# Patient Record
Sex: Female | Born: 1960 | Race: White | Hispanic: No | Marital: Married | Smoking: Never smoker
Health system: Southern US, Community
[De-identification: ages and names within clinical notes are randomized; demographics above are authoritative.]

## PROBLEM LIST (undated history)

## (undated) DIAGNOSIS — I10 Essential (primary) hypertension: Secondary | ICD-10-CM

## (undated) DIAGNOSIS — T561X1A Toxic effect of mercury and its compounds, accidental (unintentional), initial encounter: Secondary | ICD-10-CM

## (undated) DIAGNOSIS — E079 Disorder of thyroid, unspecified: Secondary | ICD-10-CM

## (undated) DIAGNOSIS — J309 Allergic rhinitis, unspecified: Secondary | ICD-10-CM

## (undated) HISTORY — PX: CHOLECYSTECTOMY: SHX55

## (undated) HISTORY — DX: Essential (primary) hypertension: I10

## (undated) HISTORY — PX: VAGINAL HYSTERECTOMY: SUR661

## (undated) HISTORY — DX: Allergic rhinitis, unspecified: J30.9

## (undated) HISTORY — DX: Toxic effect of mercury and its compounds, accidental (unintentional), initial encounter: T56.1X1A

## (undated) HISTORY — DX: Disorder of thyroid, unspecified: E07.9

---

## 2004-07-09 ENCOUNTER — Ambulatory Visit: Payer: Self-pay | Admitting: Obstetrics and Gynecology

## 2005-03-20 ENCOUNTER — Other Ambulatory Visit: Payer: Self-pay

## 2005-03-25 ENCOUNTER — Inpatient Hospital Stay: Payer: Self-pay | Admitting: Obstetrics and Gynecology

## 2005-08-19 ENCOUNTER — Ambulatory Visit: Payer: Self-pay | Admitting: Obstetrics and Gynecology

## 2006-09-15 ENCOUNTER — Ambulatory Visit: Payer: Self-pay | Admitting: Obstetrics and Gynecology

## 2007-10-08 ENCOUNTER — Ambulatory Visit: Payer: Self-pay | Admitting: Obstetrics and Gynecology

## 2009-12-04 ENCOUNTER — Ambulatory Visit: Payer: Self-pay | Admitting: Obstetrics and Gynecology

## 2011-02-07 ENCOUNTER — Ambulatory Visit: Payer: Self-pay | Admitting: Obstetrics and Gynecology

## 2012-02-19 ENCOUNTER — Ambulatory Visit: Payer: Self-pay | Admitting: Obstetrics and Gynecology

## 2013-04-29 ENCOUNTER — Ambulatory Visit: Payer: Self-pay | Admitting: Nurse Practitioner

## 2014-06-28 ENCOUNTER — Ambulatory Visit: Payer: Self-pay | Admitting: Nurse Practitioner

## 2014-07-01 ENCOUNTER — Ambulatory Visit: Payer: Self-pay | Admitting: Physician Assistant

## 2014-07-06 ENCOUNTER — Ambulatory Visit: Payer: Self-pay | Admitting: Nurse Practitioner

## 2014-08-05 HISTORY — PX: COLONOSCOPY: SHX174

## 2015-03-14 ENCOUNTER — Ambulatory Visit: Payer: Self-pay | Admitting: Family Medicine

## 2015-03-14 ENCOUNTER — Encounter: Payer: Self-pay | Admitting: Family Medicine

## 2015-03-14 ENCOUNTER — Ambulatory Visit (INDEPENDENT_AMBULATORY_CARE_PROVIDER_SITE_OTHER): Payer: BC Managed Care – PPO | Admitting: Family Medicine

## 2015-03-14 VITALS — BP 120/80 | HR 80 | Ht 65.0 in | Wt 177.0 lb

## 2015-03-14 DIAGNOSIS — E7849 Other hyperlipidemia: Secondary | ICD-10-CM | POA: Insufficient documentation

## 2015-03-14 DIAGNOSIS — I10 Essential (primary) hypertension: Secondary | ICD-10-CM

## 2015-03-14 DIAGNOSIS — E079 Disorder of thyroid, unspecified: Secondary | ICD-10-CM | POA: Diagnosis not present

## 2015-03-14 DIAGNOSIS — J301 Allergic rhinitis due to pollen: Secondary | ICD-10-CM

## 2015-03-14 DIAGNOSIS — M8580 Other specified disorders of bone density and structure, unspecified site: Secondary | ICD-10-CM | POA: Insufficient documentation

## 2015-03-14 DIAGNOSIS — Z8619 Personal history of other infectious and parasitic diseases: Secondary | ICD-10-CM | POA: Insufficient documentation

## 2015-03-14 DIAGNOSIS — Z8639 Personal history of other endocrine, nutritional and metabolic disease: Secondary | ICD-10-CM | POA: Insufficient documentation

## 2015-03-14 NOTE — Progress Notes (Signed)
Name: Deborah Vargas   MRN: [id number]    DOB: February 08, 1961   Date:03/14/2015       Progress Note  Subjective  Chief Complaint  Chief Complaint  Patient presents with  . Hypertension    Hypertension This is a chronic problem. The current episode started more than 1 year ago. The problem has been gradually improving since onset. Pertinent negatives include no anxiety, blurred vision, chest pain, headaches, malaise/fatigue, neck pain, orthopnea, palpitations, peripheral edema, PND, shortness of breath or sweats. There are no associated agents to hypertension. There are no known risk factors for coronary artery disease. Past treatments include angiotensin blockers and calcium channel blockers. The current treatment provides moderate improvement. There are no compliance problems.  There is no history of angina, kidney disease, CAD/MI, CVA, heart failure, left ventricular hypertrophy, PVD, renovascular disease or retinopathy. There is no history of chronic renal disease.    No problem-specific assessment & plan notes found for this encounter.   No past medical history on file.  Past Surgical History  Procedure Laterality Date  . Vaginal hysterectomy    . Colonoscopy  2016    Dr Luan Pulling @ [facility]- removed polyp    No family history on file.  History   Social History  . Marital Status: Married    Spouse Name: N/A  . Number of Children: N/A  . Years of Education: N/A   Occupational History  . Not on file.   Social History Main Topics  . Smoking status: Never Smoker   . Smokeless tobacco: Not on file  . Alcohol Use: No  . Drug Use: No  . Sexual Activity: Not on file   Other Topics Concern  . Not on file   Social History Narrative  . No narrative on file    Allergies  Allergen Reactions  . Diclofenac Other (See Comments)    Stiffness and bruising of the knees     Review of Systems  Constitutional: Negative for fever, chills, weight loss and malaise/fatigue.  HENT:  Positive for congestion. Negative for ear discharge, ear pain and sore throat.        Allergy issues  Eyes: Negative for blurred vision.  Respiratory: Negative for cough, sputum production, shortness of breath and wheezing.   Cardiovascular: Negative for chest pain, palpitations, orthopnea, leg swelling and PND.  Gastrointestinal: Negative for heartburn, nausea, abdominal pain, diarrhea, constipation, blood in stool and melena.  Genitourinary: Negative for dysuria, urgency, frequency and hematuria.  Musculoskeletal: Negative for myalgias, back pain, joint pain and neck pain.  Skin: Negative for rash.  Neurological: Negative for dizziness, tingling, sensory change, focal weakness and headaches.  Endo/Heme/Allergies: Negative for environmental allergies and polydipsia. Does not bruise/bleed easily.  Psychiatric/Behavioral: Negative for depression, suicidal ideas and memory loss. The patient is not nervous/anxious and does not have insomnia.      Objective  Filed Vitals:   03/14/15 1541  BP: 120/80  Pulse: 80  Height: 5\' 5"  (1.651 m)  Weight: 177 lb (80.287 kg)    Physical Exam  Constitutional: She is well-developed, well-nourished, and in no distress. No distress.  HENT:  Head: Normocephalic and atraumatic.  Right Ear: External ear normal.  Left Ear: External ear normal.  Nose: Nose normal.  Mouth/Throat: Oropharynx is clear and moist.  Eyes: Conjunctivae and EOM are normal. Pupils are equal, round, and reactive to light. Right eye exhibits no discharge. Left eye exhibits no discharge.  Neck: Normal range of motion. Neck supple. No JVD present.  No thyromegaly present.  Cardiovascular: Normal rate, regular rhythm, normal heart sounds and intact distal pulses.  Exam reveals no gallop and no friction rub.   No murmur heard. Pulmonary/Chest: Effort normal and breath sounds normal.  Abdominal: Soft. Bowel sounds are normal. She exhibits no mass. There is no tenderness. There is no  guarding.  Musculoskeletal: Normal range of motion. She exhibits no edema.  Lymphadenopathy:    She has no cervical adenopathy.  Neurological: She is alert. She has normal reflexes.  Skin: Skin is warm and dry. She is not diaphoretic.  Psychiatric: Mood and affect normal.      Assessment & Plan  Problem List Items Addressed This Visit      Cardiovascular and Mediastinum   Essential (primary) hypertension - Primary   Relevant Medications   amLODipine-benazepril (LOTREL) 10-20 MG per capsule   Other Relevant Orders   Renal Function Panel     Respiratory   Hay fever   Relevant Medications   fluticasone (FLONASE) 50 MCG/ACT nasal spray        Dr. Hay[facility]  03/14/2015

## 2015-03-15 LAB — RENAL FUNCTION PANEL
Albumin: 4.2 g/dL (ref 3.5–5.5)
BUN/Creatinine Ratio: 16 (ref 9–23)
BUN: 11 mg/dL (ref 6–24)
CHLORIDE: 104 mmol/L (ref 97–108)
CO2: 27 mmol/L (ref 18–29)
Calcium: 9.3 mg/dL (ref 8.7–10.2)
Creatinine, Ser: 0.7 mg/dL (ref 0.57–1.00)
GFR, EST AFRICAN AMERICAN: 113 mL/min/{1.73_m2} (ref >59)
GFR, EST NON AFRICAN AMERICAN: 98 mL/min/{1.73_m2} (ref >59)
Glucose: 86 mg/dL (ref 65–99)
Phosphorus: 4 mg/dL (ref 2.5–4.5)
Potassium: 4.2 mmol/L (ref 3.5–5.2)
SODIUM: 145 mmol/L — AB (ref 134–144)

## 2015-04-10 ENCOUNTER — Other Ambulatory Visit: Payer: Self-pay | Admitting: Family Medicine

## 2015-04-10 DIAGNOSIS — I10 Essential (primary) hypertension: Secondary | ICD-10-CM

## 2015-04-11 ENCOUNTER — Other Ambulatory Visit: Payer: Self-pay

## 2015-06-15 ENCOUNTER — Ambulatory Visit: Payer: BC Managed Care – PPO | Admitting: Family Medicine

## 2015-06-20 ENCOUNTER — Encounter: Payer: Self-pay | Admitting: Family Medicine

## 2015-06-20 ENCOUNTER — Ambulatory Visit (INDEPENDENT_AMBULATORY_CARE_PROVIDER_SITE_OTHER): Payer: BC Managed Care – PPO | Admitting: Family Medicine

## 2015-06-20 VITALS — BP 122/82 | HR 72 | Ht 65.0 in | Wt 180.0 lb

## 2015-06-20 DIAGNOSIS — M7552 Bursitis of left shoulder: Secondary | ICD-10-CM | POA: Diagnosis not present

## 2015-06-20 DIAGNOSIS — M7592 Shoulder lesion, unspecified, left shoulder: Secondary | ICD-10-CM

## 2015-06-20 NOTE — Patient Instructions (Signed)

## 2015-06-20 NOTE — Progress Notes (Signed)
Name: Deborah Vargas   MRN: [id number]    DOB: 01-Oct-1960   Date:06/20/2015       Progress Note  Subjective  Chief Complaint  Chief Complaint  Patient presents with  . Shoulder Pain    had colonoscopy around September- was layed on her L) side for the procedure. L) shoulder has been hurting off and on since then but has gotten worse x past couple of weeks- effecting swimming classes, sleeping. Aleve helps, but scared to use over a week    Shoulder Pain  The pain is present in the left shoulder. This is a recurrent problem. The current episode started more than 1 month ago. The problem occurs daily. The problem has been waxing and waning. The quality of the pain is described as aching. The pain is at a severity of 7/10. The pain is moderate. Pertinent negatives include no fever, inability to bear weight, itching, joint locking, joint swelling, limited range of motion, numbness, stiffness or tingling. The symptoms are aggravated by activity. She has tried acetaminophen and NSAIDS for the symptoms.    No problem-specific assessment & plan notes found for this encounter.   Past Medical History  Diagnosis Date  . Thyroid disease   . Hypertension     Past Surgical History  Procedure Laterality Date  . Vaginal hysterectomy    . Colonoscopy  2016    Dr Luan Pulling @ [facility]- removed polyp    History reviewed. No pertinent family history.  Social History   Social History  . Marital Status: Married    Spouse Name: N/A  . Number of Children: N/A  . Years of Education: N/A   Occupational History  . Not on file.   Social History Main Topics  . Smoking status: Never Smoker   . Smokeless tobacco: Not on file  . Alcohol Use: No  . Drug Use: No  . Sexual Activity: Not on file   Other Topics Concern  . Not on file   Social History Narrative    Allergies  Allergen Reactions  . Diclofenac Other (See Comments)    Stiffness and bruising of the knees     Review of Systems   Constitutional: Negative for fever, chills, weight loss and malaise/fatigue.  HENT: Negative for ear discharge, ear pain and sore throat.   Eyes: Negative for blurred vision.  Respiratory: Negative for cough, sputum production, shortness of breath and wheezing.   Cardiovascular: Negative for chest pain, palpitations and leg swelling.  Gastrointestinal: Negative for heartburn, nausea, abdominal pain, diarrhea, constipation, blood in stool and melena.  Genitourinary: Negative for dysuria, urgency, frequency and hematuria.  Musculoskeletal: Negative for myalgias, back pain, joint pain, stiffness and neck pain.  Skin: Negative for itching and rash.  Neurological: Negative for dizziness, tingling, sensory change, focal weakness, numbness and headaches.  Endo/Heme/Allergies: Negative for environmental allergies and polydipsia. Does not bruise/bleed easily.  Psychiatric/Behavioral: Negative for depression and suicidal ideas. The patient is not nervous/anxious and does not have insomnia.      Objective  Filed Vitals:   06/20/15 1553  BP: 122/82  Pulse: 72  Height:  (1.651 m)  Weight: 180 lb (81.647 kg)    Physical Exam  Constitutional: She is well-developed, well-nourished, and in no distress. No distress.  HENT:  Head: Normocephalic and atraumatic.  Right Ear: External ear normal.  Left Ear: External ear normal.  Nose: Nose normal.  Mouth/Throat: Oropharynx is clear and moist.  Eyes: Conjunctivae and EOM are normal. Pupils are  equal, round, and reactive to light. Right eye exhibits no discharge. Left eye exhibits no discharge.  Neck: Normal range of motion. Neck supple. No JVD present. No thyromegaly present.  Cardiovascular: Normal rate, regular rhythm, normal heart sounds and intact distal pulses.  Exam reveals no gallop and no friction rub.   No murmur heard. Pulmonary/Chest: Effort normal and breath sounds normal.  Abdominal: Soft. Bowel sounds are normal. She exhibits no  mass. There is no tenderness. There is no guarding.  Musculoskeletal: Normal range of motion. She exhibits tenderness. She exhibits no edema.       Left shoulder: She exhibits tenderness.  supraspinatis tendon  Lymphadenopathy:    She has no cervical adenopathy.  Neurological: She is alert. She has normal reflexes.  Skin: Skin is warm and dry. She is not diaphoretic.  Psychiatric: Mood and affect normal.      Assessment & Plan  Problem List Items Addressed This Visit    None        Dr[facility]anna Jones [facility]  06/20/2015

## 2015-06-26 ENCOUNTER — Other Ambulatory Visit: Payer: Self-pay

## 2015-06-26 DIAGNOSIS — M7592 Shoulder lesion, unspecified, left shoulder: Secondary | ICD-10-CM

## 2015-09-01 DIAGNOSIS — Z85828 Personal history of other malignant neoplasm of skin: Secondary | ICD-10-CM | POA: Insufficient documentation

## 2015-09-12 ENCOUNTER — Ambulatory Visit (INDEPENDENT_AMBULATORY_CARE_PROVIDER_SITE_OTHER): Payer: BC Managed Care – PPO | Admitting: Family Medicine

## 2015-09-12 ENCOUNTER — Encounter: Payer: Self-pay | Admitting: Family Medicine

## 2015-09-12 VITALS — BP 120/78 | HR 82 | Ht 65.0 in | Wt 175.0 lb

## 2015-09-12 DIAGNOSIS — I10 Essential (primary) hypertension: Secondary | ICD-10-CM | POA: Diagnosis not present

## 2015-09-12 DIAGNOSIS — E785 Hyperlipidemia, unspecified: Secondary | ICD-10-CM

## 2015-09-12 DIAGNOSIS — J301 Allergic rhinitis due to pollen: Secondary | ICD-10-CM

## 2015-09-12 MED ORDER — FLUTICASONE PROPIONATE 50 MCG/ACT NA SUSP: 1.0000 | NASAL | Status: DC | PRN

## 2015-09-12 MED ORDER — AMLODIPINE BESY-BENAZEPRIL HCL 10-20 MG PO CAPS: ORAL_CAPSULE | ORAL | Status: DC

## 2015-09-12 NOTE — Progress Notes (Signed)
Name: Deborah Vargas   MRN: [id number]    DOB: June 22, 1961   Date:09/12/2015       Progress Note  Subjective  Chief Complaint  Chief Complaint  Patient presents with  . Hypertension    follow up B/P    Hypertension This is a chronic problem. The current episode started more than 1 year ago. The problem has been gradually improving since onset. The problem is controlled. Associated symptoms include anxiety. Pertinent negatives include no blurred vision, chest pain, headaches, malaise/fatigue, neck pain, orthopnea, palpitations, peripheral edema, PND, shortness of breath or sweats. There are no associated agents to hypertension. There are no known risk factors for coronary artery disease. Past treatments include ACE inhibitors and lifestyle changes (exercise regimen started). The current treatment provides mild improvement. There are no compliance problems.  There is no history of angina, kidney disease, CAD/MI, CVA, heart failure, left ventricular hypertrophy, PVD, renovascular disease or retinopathy. There is no history of chronic renal disease or a hypertension causing med.    No problem-specific assessment & plan notes found for this encounter.   Past Medical History  Diagnosis Date  . Thyroid disease   . Hypertension     Past Surgical History  Procedure Laterality Date  . Vaginal hysterectomy    . Colonoscopy  2016    Dr Luan Pulling @ [facility]- removed polyp    Family History  Problem Relation Age of Onset  . Diabetes Father     Social History   Social History  . Marital Status: Married    Spouse Name: N/A  . Number of Children: N/A  . Years of Education: N/A   Occupational History  . Not on file.   Social History Main Topics  . Smoking status: Never Smoker   . Smokeless tobacco: Not on file  . Alcohol Use: No  . Drug Use: No  . Sexual Activity: Not Currently   Other Topics Concern  . Not on file   Social History Narrative    Allergies  Allergen Reactions  .  Diclofenac Other (See Comments)    Stiffness and bruising of the knees     Review of Systems  Constitutional: Negative for fever, chills, weight loss, malaise/fatigue and diaphoresis.  HENT: Negative for ear discharge, ear pain and sore throat.   Eyes: Negative for blurred vision.  Respiratory: Negative for cough, sputum production, shortness of breath and wheezing.   Cardiovascular: Negative for chest pain, palpitations, orthopnea, leg swelling and PND.  Gastrointestinal: Negative for heartburn, nausea, abdominal pain, diarrhea, constipation, blood in stool and melena.  Genitourinary: Negative for dysuria, urgency, frequency and hematuria.  Musculoskeletal: Negative for myalgias, back pain, joint pain and neck pain.  Skin: Negative for rash.  Neurological: Negative for dizziness, tingling, sensory change, focal weakness, weakness and headaches.  Endo/Heme/Allergies: Negative for environmental allergies and polydipsia. Does not bruise/bleed easily.  Psychiatric/Behavioral: Negative for depression and suicidal ideas. The patient is not nervous/anxious and does not have insomnia.      Objective  Filed Vitals:   09/12/15 0801  BP: 120/78  Pulse: 82  Height:  (1.651 m)  Weight: 175 lb (79.379 kg)    Physical Exam  Constitutional: She is well-developed, well-nourished, and in no distress. No distress.  HENT:  Head: Normocephalic and atraumatic.  Right Ear: External ear normal.  Left Ear: External ear normal.  Nose: Nose normal.  Mouth/Throat: Oropharynx is clear and moist.  Eyes: Conjunctivae and EOM are normal. Pupils are equal, round, and  reactive to light. Right eye exhibits no discharge. Left eye exhibits no discharge.  Neck: Normal range of motion. Neck supple. No JVD present. No thyromegaly present.  Cardiovascular: Normal rate, regular rhythm, normal heart sounds and intact distal pulses.  Exam reveals no gallop and no friction rub.   No murmur  heard. Pulmonary/Chest: Effort normal and breath sounds normal.  Abdominal: Soft. Bowel sounds are normal. She exhibits no mass. There is no tenderness. There is no guarding.  Musculoskeletal: Normal range of motion. She exhibits no edema.  Lymphadenopathy:    She has no cervical adenopathy.  Neurological: She is alert.  Skin: Skin is warm and dry. She is not diaphoretic.  Psychiatric: Mood and affect normal.  Nursing note and vitals reviewed.     Assessment & Plan  Problem List Items Addressed This Visit      Cardiovascular and Mediastinum   Essential (primary) hypertension - Primary   Relevant Medications   amLODipine-benazepril (LOTREL) 10-20 MG capsule   Other Relevant Orders   Renal function panel   Lipid Profile     Respiratory   Hay fever   Relevant Medications   fluticasone (FLONASE) 50 MCG/ACT nasal spray    Other Visit Diagnoses    Allergic rhinitis due to pollen        Essential hypertension        Relevant Medications    amLODipine-benazepril (LOTREL) 10-20 MG capsule    Hyperlipidemia        Relevant Medications    amLODipine-benazepril (LOTREL) 10-20 MG capsule    Other Relevant Orders    Lipid Profile         Dr. Elizabeth Sauer [facility]  09/12/2015

## 2015-09-13 LAB — RENAL FUNCTION PANEL
ALBUMIN: 4.3 g/dL (ref 3.5–5.5)
BUN/Creatinine Ratio: 17 (ref 9–23)
BUN: 13 mg/dL (ref 6–24)
CALCIUM: 10.1 mg/dL (ref 8.7–10.2)
CO2: 29 mmol/L (ref 18–29)
CREATININE: 0.77 mg/dL (ref 0.57–1.00)
Chloride: 102 mmol/L (ref 96–106)
GFR calc Af Amer: 101 mL/min/{1.73_m2} (ref >59)
GFR, EST NON AFRICAN AMERICAN: 87 mL/min/{1.73_m2} (ref >59)
Glucose: 87 mg/dL (ref 65–99)
PHOSPHORUS: 4.4 mg/dL (ref 2.5–4.5)
Potassium: 4.4 mmol/L (ref 3.5–5.2)
Sodium: 145 mmol/L — ABNORMAL HIGH (ref 134–144)

## 2015-09-13 LAB — LIPID PANEL
CHOL/HDL RATIO: 2 ratio (ref 0.0–4.4)
CHOLESTEROL TOTAL: 195 mg/dL (ref 100–199)
HDL: 100 mg/dL (ref >39)
LDL CALC: 84 mg/dL (ref 0–99)
TRIGLYCERIDES: 53 mg/dL (ref 0–149)
VLDL CHOLESTEROL CAL: 11 mg/dL (ref 5–40)

## 2015-09-18 ENCOUNTER — Other Ambulatory Visit: Payer: Self-pay | Admitting: Family Medicine

## 2015-11-13 ENCOUNTER — Encounter: Payer: Self-pay | Admitting: Emergency Medicine

## 2015-11-13 ENCOUNTER — Ambulatory Visit: Payer: BC Managed Care – PPO | Admitting: Family Medicine

## 2015-11-13 ENCOUNTER — Ambulatory Visit
Admission: EM | Admit: 2015-11-13 | Discharge: 2015-11-13 | Disposition: A | Discharge status: Home / Self Care | Payer: BC Managed Care – PPO | Attending: Family Medicine | Admitting: Family Medicine

## 2015-11-13 DIAGNOSIS — J302 Other seasonal allergic rhinitis: Secondary | ICD-10-CM

## 2015-11-13 DIAGNOSIS — J069 Acute upper respiratory infection, unspecified: Secondary | ICD-10-CM | POA: Diagnosis not present

## 2015-11-13 DIAGNOSIS — J329 Chronic sinusitis, unspecified: Secondary | ICD-10-CM

## 2015-11-13 LAB — RAPID INFLUENZA A&B ANTIGENS
Influenza A (ARMC): NEGATIVE
Influenza B (ARMC): NEGATIVE

## 2015-11-13 MED ORDER — AZITHROMYCIN 250 MG PO TABS: ORAL_TABLET | ORAL | Status: DC

## 2015-11-13 NOTE — ED Notes (Signed)
Patient c/o cough, chest congestion, HAs, and bodyaches for 5 days.  Patient reports low grade fever.

## 2015-11-13 NOTE — ED Provider Notes (Signed)
CSN: [id number]     Arrival date & time 11/13/15  1712 History   First MD Initiated Contact with Patient 11/13/15 1806     Chief Complaint  Patient presents with  . Generalized Body Aches  . Headache  . Cough   (Consider location/radiation/quality/duration/timing/severity/associated sxs/prior Treatment) HPI  This a 55 year old female who presents with 5 day history of low grade fever cough chest congestion headaches body aches and facial pain. She works at [employer] and her supervisor became concerned that she may have the flu. She did have her flu shot this year. Initially she had an appointment with Dr. Yetta Barre but canceled only to begin feeling worse this afternoon. She states that typically she is her at her worst from 3 in the afternoon until 10 at night. She has had recurrent sinusitis treated on several occasions by Dr.Juengle and Dr. Yetta Barre with antibiotic. Temperature today 97.9 pulse 69 respirations 16 blood pressure 114/63 O2 sat 99% on room air She has been coughing up thick green mucus today.   Past Medical History  Diagnosis Date  . Thyroid disease   . Hypertension    Past Surgical History  Procedure Laterality Date  . Vaginal hysterectomy    . Colonoscopy  2016    Dr Luan Pulling @ [facility] removed polyp   Family History  Problem Relation Age of Onset  . Diabetes Father    Social History  Substance Use Topics  . Smoking status: Never Smoker   . Smokeless tobacco: None  . Alcohol Use: No   OB History    No data available     Review of Systems  Constitutional: Positive for fever, chills, activity change and fatigue.  HENT: Positive for postnasal drip, sinus pressure and sneezing.   Respiratory: Positive for cough. Negative for shortness of breath, wheezing and stridor.   All other systems reviewed and are negative.   Allergies  Diclofenac  Home Medications   Prior to Admission medications   Medication Sig Start Date End Date Taking? Authorizing Provider   amLODipine-benazepril (LOTREL) 10-20 MG capsule TAKE 1 CAPSULE BY MOUTH EVERY DAY 09/12/15   Duanne Limerick, MD  azithromycin (ZITHROMAX Z-PAK) 250 MG tablet Use as per package instructions 11/13/15   Lutricia Feil, PA-C  Calcium Carbonate-Vitamin D (CALTRATE 600+D PO) Take 1 tablet by mouth daily at 6 (six) AM.    Historical Provider, MD  ergocalciferol (VITAMIN D2) 50000 UNITS capsule Take 1 capsule by mouth every 14 (fourteen) days. Dr Sherryl Manges    Historical Provider, MD  fexofenadine [facility] ALLERGY) 60 MG tablet Take 1 tablet by mouth daily at 6 (six) AM.    Historical Provider, MD  fluticasone (FLONASE) 50 MCG/ACT nasal spray USE 1 SPRAY IN EACH NOSTRIL DAILY 09/18/15   Duanne Limerick, MD  levothyroxine (SYNTHROID, LEVOTHROID) 137 MCG tablet Take 137 mcg by mouth daily before breakfast. Dr Sherryl Manges    Historical Provider, MD   Meds Ordered and Administered this Visit  Medications - No data to display  BP 114/63 mmHg  Pulse 69  Temp(Src) 97.9 F (36.6 C) (Tympanic)  Resp 16  Ht 5' 5.5" (1.664 m)  Wt 166 lb (75.297 kg)  BMI 27.19 kg/m2  SpO2 99% No data found.   Physical Exam  Constitutional: She is oriented to person, place, and time. She appears well-developed and well-nourished. No distress.  HENT:  Head: Normocephalic and atraumatic.  Right Ear: External ear normal.  Left Ear: External ear normal.  Nose: Nose normal.  Mouth/Throat: No oropharyngeal exudate.  Eyes: Conjunctivae are normal. Pupils are equal, round, and reactive to light.  Neck: Normal range of motion. Neck supple.  Pulmonary/Chest: Effort normal and breath sounds normal. No respiratory distress. She has no wheezes. She has no rales.  Musculoskeletal: Normal range of motion. She exhibits no edema or tenderness.  Lymphadenopathy:    She has no cervical adenopathy.  Neurological: She is alert and oriented to person, place, and time.  Skin: Skin is warm and dry. She is not diaphoretic.  Psychiatric: She has a  normal mood and affect. Her behavior is normal. Judgment and thought content normal.  Nursing note and vitals reviewed.   ED Course  Procedures (including critical care time)  Labs Review Labs Reviewed  RAPID INFLUENZA A&B ANTIGENS [facility] ONLY)    Imaging Review No results found.   Visual Acuity Review  Right Eye Distance:   Left Eye Distance:   Bilateral Distance:    Right Eye Near:   Left Eye Near:    Bilateral Near:         MDM   1. Seasonal allergies   2. Acute URI    Discharge Medication List as of 11/13/2015  6:27 PM    START taking these medications   Details  azithromycin (ZITHROMAX Z-PAK) 250 MG tablet Use as per package instructions, Normal      Plan: 1. Test/x-ray results and diagnosis reviewed with patient 2. rx as per orders; risks, benefits, potential side effects reviewed with patient 3. Recommend supportive treatment with Rest and fluids. She has a Nettie pot at home I recommended that she begin using that followed by the Flonase. If she is not improving or worsening she should follow-up the ENT or Dr. Yetta BarreJones. 4. F/u prn if symptoms worsen or don't improve     Lutricia FeilWilliam P Roemer, PA-C 11/13/15 1844

## 2016-01-11 ENCOUNTER — Ambulatory Visit
Admission: EM | Admit: 2016-01-11 | Discharge: 2016-01-11 | Disposition: A | Discharge status: Home / Self Care | Payer: BC Managed Care – PPO | Attending: Family Medicine | Admitting: Family Medicine

## 2016-01-11 ENCOUNTER — Encounter: Payer: Self-pay | Admitting: Gynecology

## 2016-01-11 DIAGNOSIS — L259 Unspecified contact dermatitis, unspecified cause: Secondary | ICD-10-CM

## 2016-01-11 DIAGNOSIS — B354 Tinea corporis: Secondary | ICD-10-CM | POA: Diagnosis not present

## 2016-01-11 MED ORDER — METHYLPREDNISOLONE 4 MG PO TBPK: ORAL_TABLET | ORAL | Status: DC

## 2016-01-11 MED ORDER — KETOCONAZOLE 2 % EX CREA: 1.0000 "application " | TOPICAL_CREAM | Freq: Every day | CUTANEOUS | Status: DC

## 2016-01-11 NOTE — Discharge Instructions (Signed)
Contact Dermatitis °Dermatitis is redness, soreness, and swelling (inflammation) of the skin. Contact dermatitis is a reaction to certain substances that touch the skin. There are two types of contact dermatitis:  °· Irritant contact dermatitis. This type is caused by something that irritates your skin, such as dry hands from washing them too much. This type does not require previous exposure to the substance for a reaction to occur. This type is more common. °· Allergic contact dermatitis. This type is caused by a substance that you are allergic to, such as a nickel allergy or poison ivy. This type only occurs if you have been exposed to the substance (allergen) before. Upon a repeat exposure, your body reacts to the substance. This type is less common. °CAUSES  °Many different substances can cause contact dermatitis. Irritant contact dermatitis is most commonly caused by exposure to:  °· Makeup.   °· Soaps.   °· Detergents.   °· Bleaches.   °· Acids.   °· Metal salts, such as nickel.   °Allergic contact dermatitis is most commonly caused by exposure to:  °· Poisonous plants.   °· Chemicals.   °· Jewelry.   °· Latex.   °· Medicines.   °· Preservatives in products, such as clothing.   °RISK FACTORS °This condition is more likely to develop in:  °· People who have jobs that expose them to irritants or allergens. °· People who have certain medical conditions, such as asthma or eczema.   °SYMPTOMS  °Symptoms of this condition may occur anywhere on your body where the irritant has touched you or is touched by you. Symptoms include: °· Dryness or flaking.   °· Redness.   °· Cracks.   °· Itching.   °· Pain or a burning feeling.   °· Blisters. °· Drainage of small amounts of blood or clear fluid from skin cracks. °With allergic contact dermatitis, there may also be swelling in areas such as the eyelids, mouth, or genitals.  °DIAGNOSIS  °This condition is diagnosed with a medical history and physical exam. A patch skin test  may be performed to help determine the cause. If the condition is related to your job, you may need to see an occupational medicine specialist. °TREATMENT °Treatment for this condition includes figuring out what caused the reaction and protecting your skin from further contact. Treatment may also include:  °· Steroid creams or ointments. Oral steroid medicines may be needed in more severe cases. °· Antibiotics or antibacterial ointments, if a skin infection is present. °· Antihistamine lotion or an antihistamine taken by mouth to ease itching. °· A bandage (dressing). °HOME CARE INSTRUCTIONS °Skin Care  °· Moisturize your skin as needed.   °· Apply cool compresses to the affected areas. °· Try taking a bath with: °¨ Epsom salts. Follow the instructions on the packaging. You can get these at your local pharmacy or grocery store. °¨ Baking soda. Pour a small amount into the bath as directed by your health care provider. °¨ Colloidal oatmeal. Follow the instructions on the packaging. You can get this at your local pharmacy or grocery store. °· Try applying baking soda paste to your skin. Stir water into baking soda until it reaches a paste-like consistency. °· Do not scratch your skin. °· Bathe less frequently, such as every other day. °· Bathe in lukewarm water. Avoid using hot water. °Medicines  °· Take or apply over-the-counter and prescription medicines only as told by your health care provider.   °· If you were prescribed an antibiotic medicine, take or apply your antibiotic as told by your health care provider. Do not stop using the   antibiotic even if your condition starts to improve. °General Instructions  °· Keep all follow-up visits as told by your health care provider. This is important. °· Avoid the substance that caused your reaction. If you do not know what caused it, keep a journal to try to track what caused it. Write down: °¨ What you eat. °¨ What cosmetic products you use. °¨ What you drink. °¨ What  you wear in the affected area. This includes jewelry. °· If you were given a dressing, take care of it as told by your health care provider. This includes when to change and remove it. °SEEK MEDICAL CARE IF:  °· Your condition does not improve with treatment. °· Your condition gets worse. °· You have signs of infection such as swelling, tenderness, redness, soreness, or warmth in the affected area. °· You have a fever. °· You have new symptoms. °SEEK IMMEDIATE MEDICAL CARE IF:  °· You have a severe headache, neck pain, or neck stiffness. °· You vomit. °· You feel very sleepy. °· You notice red streaks coming from the affected area. °· Your bone or joint underneath the affected area becomes painful after the skin has healed. °· The affected area turns darker. °· You have difficulty breathing. °  °This information is not intended to replace advice given to you by your health care provider. Make sure you discuss any questions you have with your health care provider. °  °Document Released: 07/19/2000 Document Revised: 04/12/2015 Document Reviewed: 12/07/2014 °Elsevier Interactive Patient Education ©2016 Elsevier Inc. ° °Rash °A rash is a change in the color or texture of the skin. There are many different types of rashes. You may have other problems that accompany your rash. °CAUSES  °· Infections. °· Allergic reactions. This can include allergies to pets or foods. °· Certain medicines. °· Exposure to certain chemicals, soaps, or cosmetics. °· Heat. °· Exposure to poisonous plants. °· Tumors, both cancerous and noncancerous. °SYMPTOMS  °· Redness. °· Scaly skin. °· Itchy skin. °· Dry or cracked skin. °· Bumps. °· Blisters. °· Pain. °DIAGNOSIS  °Your caregiver may do a physical exam to determine what type of rash you have. A skin sample (biopsy) may be taken and examined under a microscope. °TREATMENT  °Treatment depends on the type of rash you have. Your caregiver may prescribe certain medicines. For serious conditions,  you may need to see a skin doctor (dermatologist). °HOME CARE INSTRUCTIONS  °· Avoid the substance that caused your rash. °· Do not scratch your rash. This can cause infection. °· You may take cool baths to help stop itching. °· Only take over-the-counter or prescription medicines as directed by your caregiver. °· Keep all follow-up appointments as directed by your caregiver. °SEEK IMMEDIATE MEDICAL CARE IF: °· You have increasing pain, swelling, or redness. °· You have a fever. °· You have new or severe symptoms. °· You have body aches, diarrhea, or vomiting. °· Your rash is not better after 3 days. °MAKE SURE YOU: °· Understand these instructions. °· Will watch your condition. °· Will get help right away if you are not doing well or get worse. °  °This information is not intended to replace advice given to you by your health care provider. Make sure you discuss any questions you have with your health care provider. °  °Document Released: 07/12/2002 Document Revised: 08/12/2014 Document Reviewed: 12/07/2014 °Elsevier Interactive Patient Education ©2016 Elsevier Inc. ° °

## 2016-01-11 NOTE — ED Notes (Signed)
Patient c/o rash on bilateral leg x yesterday. Per patient ? Wore gym clothing that she did not wash for 2 weeks. Patient also stated had tick bite  X 2 weeks ago.

## 2016-01-11 NOTE — ED Provider Notes (Signed)
CSN: [id number]     Arrival date & time 01/11/16  1650 History   First MD Initiated Contact with Patient 01/11/16 1809     Chief Complaint  Patient presents with  . Rash   (Consider location/radiation/quality/duration/timing/severity/associated sxs/prior Treatment) HPI   This 55 year old female who presents with a rash on both her legs that has been there since yesterday. The patient states that she wore gym clothing and she had not washed for 2 weeks and had them placed them in a gym bag unwashed after she had exercise at her last session. During her first return session of exercise she noticed something was wrong and kept itching on her legs as the gym spandex clothing rubbed against it. Afterwards she noticed a rash on the inner thighs of both of her legs extending into the lower legs where else on her body. He thinks that it may have started on her arm after she rubbed her legs and touched her arm. It is not very itchy. She had just returned from a trip to [place] but was not in any wooded areas and did not spend much time outside. He has a history of [facility] spotted fever treated in the past. She does relate a tick bite about 2 weeks prior to this episode.       Past Medical History  Diagnosis Date  . Thyroid disease   . Hypertension    Past Surgical History  Procedure Laterality Date  . Vaginal hysterectomy    . Colonoscopy  2016    Dr Luan Pulling @ [facility]- removed polyp   Family History  Problem Relation Age of Onset  . Diabetes Father    Social History  Substance Use Topics  . Smoking status: Never Smoker   . Smokeless tobacco: None  . Alcohol Use: No   OB History    No data available     Review of Systems  Constitutional: Positive for activity change. Negative for fever, chills and fatigue.  Skin: Positive for color change and rash.  All other systems reviewed and are negative.   Allergies  Diclofenac  Home Medications   Prior to Admission medications    Medication Sig Start Date End Date Taking? Authorizing Provider  amLODipine-benazepril (LOTREL) 10-20 MG capsule TAKE 1 CAPSULE BY MOUTH EVERY DAY 09/12/15  Yes Duanne Limerick, MD  Calcium Carbonate-Vitamin D (CALTRATE 600+D PO) Take 1 tablet by mouth daily at 6 (six) AM.   Yes Historical Provider, MD  ergocalciferol (VITAMIN D2) 50000 UNITS capsule Take 1 capsule by mouth every 14 (fourteen) days. Dr Sherryl Manges   Yes Historical Provider, MD  fexofenadine [facility] ALLERGY) 60 MG tablet Take 1 tablet by mouth daily at 6 (six) AM.   Yes Historical Provider, MD  fluticasone (FLONASE) 50 MCG/ACT nasal spray USE 1 SPRAY IN EACH NOSTRIL DAILY 09/18/15  Yes Duanne Limerick, MD  levothyroxine (SYNTHROID, LEVOTHROID) 137 MCG tablet Take 137 mcg by mouth daily before breakfast. Dr Sherryl Manges   Yes Historical Provider, MD  azithromycin (ZITHROMAX Z-PAK) 250 MG tablet Use as per package instructions 11/13/15   Lutricia Feil, PA-C  ketoconazole (NIZORAL) 2 % cream Apply 1 application topically daily. 01/11/16   Lutricia Feil, PA-C  methylPREDNISolone (MEDROL DOSEPAK) 4 MG TBPK tablet Take as per package instructions 01/11/16   Lutricia Feil, PA-C   Meds Ordered and Administered this Visit  Medications - No data to display  BP 108/74 mmHg  Pulse 66  Temp(Src) 98.3 F (  36.8 C) (Oral)  Resp 16  Ht 5\' 5"  (1.651 m)  Wt 161 lb (73.029 kg)  BMI 26.79 kg/m2  SpO2 99% No data found.   Physical Exam  Constitutional: She is oriented to person, place, and time. She appears well-developed and well-nourished. No distress.  HENT:  Head: Normocephalic and atraumatic.  Eyes: Conjunctivae are normal. Pupils are equal, round, and reactive to light.  Neck: Normal range of motion. Neck supple.  Musculoskeletal: Normal range of motion. She exhibits no edema or tenderness.  Neurological: She is alert and oriented to person, place, and time.  Skin: Skin is warm and dry. Rash noted. She is not diaphoretic.  Examination of the  bilateral lower extremities shows a macular papular rash that is confluent of the medial thighs extending into the upper leg at approximately mid calf level. The face neck torso and arms are spared.  Psychiatric: She has a normal mood and affect. Her behavior is normal. Judgment and thought content normal.  Nursing note and vitals reviewed.   ED Course  Procedures (including critical care time)  Labs Review Labs Reviewed - No data to display  Imaging Review No results found.   Visual Acuity Review  Right Eye Distance:   Left Eye Distance:   Bilateral Distance:    Right Eye Near:   Left Eye Near:    Bilateral Near:         MDM   1. Contact dermatitis   2. Tinea corporis    Discharge Medication List as of 01/11/2016  6:47 PM    START taking these medications   Details  ketoconazole (NIZORAL) 2 % cream Apply 1 application topically daily., Starting 01/11/2016, Until Discontinued, Normal    methylPREDNISolone (MEDROL DOSEPAK) 4 MG TBPK tablet Take as per package instructions, Normal      Plan: 1. Test/x-ray results and diagnosis reviewed with patient 2. rx as per orders; risks, benefits, potential side effects reviewed with patient 3. Recommend supportive treatment with Use of cotton gym clothes until the rash resolves. If the rash does not resolve recommended that she be seen by a dermatologist.Told her that we believe that this is a contact dermatitis but more likely has an overlay of fungal infection from the dirty clothing that she was wearing that had been left in a gym bag wet for 2 weeks. Is not a rash that you would expect to see [facility] 4. F/u prn if symptoms worsen or don't improve     Lutricia FeilWilliam P Brayden Brodhead, PA-C 01/11/16 2034

## 2016-02-18 ENCOUNTER — Ambulatory Visit
Admission: EM | Admit: 2016-02-18 | Discharge: 2016-02-18 | Disposition: A | Discharge status: Home / Self Care | Payer: BC Managed Care – PPO | Attending: Family Medicine | Admitting: Family Medicine

## 2016-02-18 ENCOUNTER — Encounter: Payer: Self-pay | Admitting: Gynecology

## 2016-02-18 DIAGNOSIS — S86912A Strain of unspecified muscle(s) and tendon(s) at lower leg level, left leg, initial encounter: Secondary | ICD-10-CM

## 2016-02-18 DIAGNOSIS — S86812A Strain of other muscle(s) and tendon(s) at lower leg level, left leg, initial encounter: Secondary | ICD-10-CM

## 2016-02-18 NOTE — Discharge Instructions (Signed)
Get yourself a good neoprene sleeve.  Ice several times daily.  Hold off on activity (involving the legs) for the next few days to 1 week.  800 mg of Ibuprofen up to three times daily as needed.  Take care  Dr. Adriana Vargas   Knee Pain Knee pain is a very common symptom and can have many causes. Knee pain often goes away when you follow your health care provider's instructions for relieving pain and discomfort at home. However, knee pain can develop into a condition that needs treatment. Some conditions may include:  Arthritis caused by wear and tear (osteoarthritis).  Arthritis caused by swelling and irritation (rheumatoid arthritis or gout).  A cyst or growth in your knee.  An infection in your knee joint.  An injury that will not heal.  Damage, swelling, or irritation of the tissues that support your knee (torn ligaments or tendinitis). If your knee pain continues, additional tests may be ordered to diagnose your condition. Tests may include X-rays or other imaging studies of your knee. You may also need to have fluid removed from your knee. Treatment for ongoing knee pain depends on the cause, but treatment may include:  Medicines to relieve pain or swelling.  Steroid injections in your knee.  Physical therapy.  Surgery. HOME CARE INSTRUCTIONS  Take medicines only as directed by your health care provider.  Rest your knee and keep it raised (elevated) while you are resting.  Do not do things that cause or worsen pain.  Avoid high-impact activities or exercises, such as running, jumping rope, or doing jumping jacks.  Apply ice to the knee area:  Put ice in a plastic bag.  Place a towel between your skin and the bag.  Leave the ice on for 20 minutes, 2-3 times a day.  Ask your health care provider if you should wear an elastic knee support.  Keep a pillow under your knee when you sleep.  Lose weight if you are overweight. Extra weight can put pressure on your  knee.  Do not use any tobacco products, including cigarettes, chewing tobacco, or electronic cigarettes. If you need help quitting, ask your health care provider. Smoking may slow the healing of any bone and joint problems that you may have. SEEK MEDICAL CARE IF:  Your knee pain continues, changes, or gets worse.  You have a fever along with knee pain.  Your knee buckles or locks up.  Your knee becomes more swollen. SEEK IMMEDIATE MEDICAL CARE IF:   Your knee joint feels hot to the touch.  You have chest pain or trouble breathing.   This information is not intended to replace advice given to you by your health care provider. Make sure you discuss any questions you have with your health care provider.   Document Released: 05/19/2007 Document Revised: 08/12/2014 Document Reviewed: 03/07/2014 Elsevier Interactive Patient Education [employer]2016 Elsevier Inc.

## 2016-02-18 NOTE — ED Provider Notes (Signed)
CSN: [id number]     Arrival date & time 02/18/16  1304 History   First MD Initiated Contact with Patient 02/18/16 1312     Chief Complaint  Patient presents with  . Knee Pain   (Consider location/radiation/quality/duration/timing/severity/associated sxs/prior Treatment) HPI  55 year old female presents with complaints of left knee pain.  She states that on Friday she was painting. She was doing this in a seated position. When she arose she developed left medial knee pain. This has continued to persist. She states it was particularly bothersome this morning when she attempted to sit up out of the choir loft at church. Pain is moderate in severity. Exacerbated with activity/standing. She has taken ibuprofen 800 mg without significant relief (although this was done just a few hours ago). No reports of swelling. No reports of erythema. No recent fall, trauma, injury. No other complaints at this time.   Past Medical History  Diagnosis Date  . Thyroid disease   . Hypertension    Past Surgical History  Procedure Laterality Date  . Vaginal hysterectomy    . Colonoscopy  2016    [facility]   Family History  Problem Relation Age of Onset  . Diabetes Father    Social History  Substance Use Topics  . Smoking status: Never Smoker   . Smokeless tobacco: None  . Alcohol Use: No   OB History    No data available     Review of Systems  Constitutional: Negative.   Musculoskeletal:       Left knee pain.    Allergies  Diclofenac  Home Medications   Prior to Admission medications   Medication Sig Start Date End Date Taking? Authorizing Provider  amLODipine-benazepril (LOTREL) 10-20 MG capsule TAKE 1 CAPSULE BY MOUTH EVERY DAY 09/12/15  Yes Duanne Limerickeanna C Jones, MD  Calcium Carbonate-Vitamin D (CALTRATE 600+D PO) Take 1 tablet by mouth daily at 6 (six) AM.   Yes Historical Provider, MD  ergocalciferol (VITAMIN D2) 50000 UNITS capsule Take 1 capsule by mouth every 14  (fourteen) days. Dr Sherryl MangesSprat   Yes Historical Provider, MD  fexofenadine [facility](ALLEGRA ALLERGY) 60 MG tablet Take 1 tablet by mouth daily at 6 (six) AM.   Yes Historical Provider, MD  fluticasone (FLONASE) 50 MCG/ACT nasal spray USE 1 SPRAY IN EACH NOSTRIL DAILY 09/18/15  Yes Duanne Limerickeanna C Jones, MD  ketoconazole (NIZORAL) 2 % cream Apply 1 application topically daily. 01/11/16  Yes Lutricia FeilWilliam P Roemer, PA-C  levothyroxine (SYNTHROID, LEVOTHROID) 137 MCG tablet Take 137 mcg by mouth daily before breakfast. Dr Sherryl MangesSprat   Yes Historical Provider, MD  azithromycin (ZITHROMAX Z-PAK) 250 MG tablet Use as per package instructions 11/13/15   Lutricia FeilWilliam P Roemer, PA-C  methylPREDNISolone (MEDROL DOSEPAK) 4 MG TBPK tablet Take as per package instructions 01/11/16   Lutricia FeilWilliam P Roemer, PA-C   Meds Ordered and Administered this Visit  Medications - No data to display  BP 112/66 mmHg  Pulse 66  Temp(Src) 98.2 F (36.8 C)  Resp 16  Ht 5\' 5"  (1.651 m)  Wt 160 lb (72.576 kg)  BMI 26.63 kg/m2  SpO2 100% No data found.  Physical Exam  Constitutional: She is oriented to person, place, and time. She appears well-developed. No distress.  Pulmonary/Chest: Effort normal.  Musculoskeletal:  Knee: Left Normal to inspection with no erythema or effusion or obvious bony abnormalities.  Palpation normal with no warmth, joint line tenderness, patellar tenderness, or condyle tenderness. ROM full in flexion and extension  and lower leg rotation.   Ligaments with solid consistent endpoints including ACL, PCL, LCL, MCL. Crepitus with knee extension.    Neurological: She is alert and oriented to person, place, and time.  Psychiatric: She has a normal mood and affect.  Vitals reviewed.  ED Course  Procedures (including critical care time)  MDM   1. Knee strain, left, initial encounter    55 year old female presents with left medial knee pain. Knee exam unremarkable. No effusion. No recent fall or trauma to warrant x-ray at this  time. Likely strain. Advised ice, OTC Ibuprofen 800 mg TID PRN, and neoprene sleeve.    Tommie Sams, DO 02/18/16 1357

## 2016-02-18 NOTE — ED Notes (Signed)
Per patient history of left knee pain. Patient stated painful to walk.

## 2016-04-01 ENCOUNTER — Encounter: Payer: Self-pay | Admitting: Family Medicine

## 2016-04-01 ENCOUNTER — Ambulatory Visit (INDEPENDENT_AMBULATORY_CARE_PROVIDER_SITE_OTHER): Payer: BC Managed Care – PPO | Admitting: Family Medicine

## 2016-04-01 VITALS — BP 102/70 | HR 60 | Ht 65.0 in | Wt 157.0 lb

## 2016-04-01 DIAGNOSIS — I10 Essential (primary) hypertension: Secondary | ICD-10-CM | POA: Diagnosis not present

## 2016-04-01 DIAGNOSIS — K120 Recurrent oral aphthae: Secondary | ICD-10-CM | POA: Diagnosis not present

## 2016-04-01 MED ORDER — AMLODIPINE BESY-BENAZEPRIL HCL 5-10 MG PO CAPS: 1.0000 | ORAL_CAPSULE | Freq: Every day | ORAL | 5 refills | Status: DC

## 2016-04-01 NOTE — Progress Notes (Signed)
Name: Deborah Vargas   MRN: [id number]    DOB: 11/20/1960   Date:04/01/2016       Progress Note  Subjective  Chief Complaint  Chief Complaint  Patient presents with  . Hypertension    B/P check  . Mouth Lesions    ulcer on bottom lip    Patient noted ulcer Friday night.   Hypertension  This is a chronic problem. The current episode started more than 1 year ago. The problem has been rapidly improving since onset. The problem is controlled. Pertinent negatives include no anxiety, blurred vision, chest pain, headaches, malaise/fatigue, neck pain, orthopnea, palpitations, peripheral edema, PND, shortness of breath or sweats. There are no associated agents to hypertension. There are no known risk factors for coronary artery disease. Past treatments include calcium channel blockers and ACE inhibitors. There are no compliance problems.  There is no history of angina, kidney disease, CAD/MI, CVA, heart failure, left ventricular hypertrophy, PVD, renovascular disease or retinopathy. There is no history of chronic renal disease or a hypertension causing med.  Mouth Lesions   Associated symptoms include mouth sores. Pertinent negatives include no orthopnea, no fever, no abdominal pain, no constipation, no diarrhea, no nausea, no ear discharge, no ear pain, no headaches, no sore throat, no neck pain, no cough, no wheezing and no rash.    No problem-specific Assessment & Plan notes found for this encounter.   Past Medical History:  Diagnosis Date  . Hypertension   . Thyroid disease     Past Surgical History:  Procedure Laterality Date  . COLONOSCOPY  2016   [facility]  . VAGINAL HYSTERECTOMY      Family History  Problem Relation Age of Onset  . Diabetes Father     Social History   Social History  . Marital status: Married    Spouse name: N/A  . Number of children: N/A  . Years of education: N/A   Occupational History  . Not on file.   Social History  Main Topics  . Smoking status: Never Smoker  . Smokeless tobacco: Not on file  . Alcohol use No  . Drug use: No  . Sexual activity: Not Currently   Other Topics Concern  . Not on file   Social History Narrative  . No narrative on file    Allergies  Allergen Reactions  . Diclofenac Other (See Comments)    Stiffness and bruising of the knees     Review of Systems  Constitutional: Negative for chills, fever, malaise/fatigue and weight loss.  HENT: Positive for mouth sores. Negative for ear discharge, ear pain and sore throat.   Eyes: Negative for blurred vision.  Respiratory: Negative for cough, sputum production, shortness of breath and wheezing.   Cardiovascular: Negative for chest pain, palpitations, orthopnea, leg swelling and PND.  Gastrointestinal: Negative for abdominal pain, blood in stool, constipation, diarrhea, heartburn, melena and nausea.  Genitourinary: Negative for dysuria, frequency, hematuria and urgency.  Musculoskeletal: Negative for back pain, joint pain, myalgias and neck pain.  Skin: Negative for rash.  Neurological: Negative for dizziness, tingling, sensory change, focal weakness and headaches.  Endo/Heme/Allergies: Negative for environmental allergies and polydipsia. Does not bruise/bleed easily.  Psychiatric/Behavioral: Negative for depression and suicidal ideas. The patient is not nervous/anxious and does not have insomnia.      Objective  Vitals:   04/01/16 0807  BP: 102/70  Pulse: 60  Weight: 157 lb (71.2 kg)  Height: 5\' 5"  (1.651 m)  Physical Exam  Constitutional: She is well-developed, well-nourished, and in no distress. No distress.  HENT:  Head: Normocephalic and atraumatic.  Right Ear: External ear normal.  Left Ear: External ear normal.  Nose: Nose normal.  Mouth/Throat: Oropharynx is clear and moist. Oral lesions present.    Shallow ulceration   Eyes: Conjunctivae and EOM are normal. Pupils are equal, round, and reactive to  light. Right eye exhibits no discharge. Left eye exhibits no discharge.  Neck: Normal range of motion. Neck supple. No JVD present. No thyromegaly present.  Cardiovascular: Normal rate, regular rhythm, normal heart sounds and intact distal pulses.  Exam reveals no gallop and no friction rub.   No murmur heard. Pulmonary/Chest: Effort normal and breath sounds normal. She has no wheezes. She has no rales.  Abdominal: Soft. Bowel sounds are normal. She exhibits no mass. There is no tenderness. There is no guarding.  Musculoskeletal: Normal range of motion. She exhibits no edema.  Lymphadenopathy:    She has no cervical adenopathy.  Neurological: She is alert. She has normal reflexes.  Skin: Skin is warm and dry. She is not diaphoretic.  Psychiatric: Mood and affect normal.      Assessment & Plan  Problem List Items Addressed This Visit      Cardiovascular and Mediastinum   Essential (primary) hypertension - Primary   Relevant Medications   amLODipine-benazepril (LOTREL) 5-10 MG capsule    Other Visit Diagnoses    Aphthous ulcer       suggest kenalog orabase        Dr. [facility]  04/01/16

## 2016-06-23 DIAGNOSIS — T561X1A Toxic effect of mercury and its compounds, accidental (unintentional), initial encounter: Secondary | ICD-10-CM | POA: Insufficient documentation

## 2016-08-19 ENCOUNTER — Ambulatory Visit (INDEPENDENT_AMBULATORY_CARE_PROVIDER_SITE_OTHER): Payer: BC Managed Care – PPO | Admitting: Family Medicine

## 2016-08-19 ENCOUNTER — Encounter: Payer: Self-pay | Admitting: Family Medicine

## 2016-08-19 VITALS — BP 120/82 | HR 80 | Ht 65.0 in | Wt 151.0 lb

## 2016-08-19 DIAGNOSIS — I10 Essential (primary) hypertension: Secondary | ICD-10-CM

## 2016-08-19 MED ORDER — AMLODIPINE BESY-BENAZEPRIL HCL 5-10 MG PO CAPS
1.0000 | ORAL_CAPSULE | Freq: Every day | ORAL | 1 refills | Status: DC
Start: 2016-08-19 — End: 2017-04-01

## 2016-08-19 NOTE — Progress Notes (Signed)
Name: Deborah Vargas   MRN: [id number]    DOB: 1961/04/25   Date:08/19/2016       Progress Note  Subjective  Chief Complaint  Chief Complaint  Patient presents with  . Hypertension    last labs were April    Hypertension  This is a chronic problem. The current episode started more than 1 year ago. The problem has been gradually improving since onset. The problem is controlled. Pertinent negatives include no anxiety, blurred vision, chest pain, headaches, malaise/fatigue, neck pain, orthopnea, palpitations, peripheral edema, PND, shortness of breath or sweats. There are no associated agents to hypertension. There are no known risk factors for coronary artery disease. Past treatments include ACE inhibitors and calcium channel blockers. The current treatment provides mild improvement. There are no compliance problems.  There is no history of angina, kidney disease, CAD/MI, CVA, heart failure, left ventricular hypertrophy, PVD, renovascular disease or retinopathy. There is no history of chronic renal disease or a hypertension causing med.    No problem-specific Assessment & Plan notes found for this encounter.   Past Medical History:  Diagnosis Date  . Hypertension   . Thyroid disease     Past Surgical History:  Procedure Laterality Date  . COLONOSCOPY  2016   Dr Luan Pulling @ [facility]- removed polyp  . VAGINAL HYSTERECTOMY      Family History  Problem Relation Age of Onset  . Diabetes Father     Social History   Social History  . Marital status: Married    Spouse name: N/A  . Number of children: N/A  . Years of education: N/A   Occupational History  . Not on file.   Social History Main Topics  . Smoking status: Never Smoker  . Smokeless tobacco: Not on file  . Alcohol use No  . Drug use: No  . Sexual activity: Not Currently   Other Topics Concern  . Not on file   Social History Narrative  . No narrative on file    Allergies  Allergen Reactions  . Diclofenac  Other (See Comments)    Stiffness and bruising of the knees     Review of Systems  Constitutional: Negative for chills, fever, malaise/fatigue and weight loss.  HENT: Negative for ear discharge, ear pain and sore throat.   Eyes: Negative for blurred vision.  Respiratory: Negative for cough, sputum production, shortness of breath and wheezing.   Cardiovascular: Negative for chest pain, palpitations, orthopnea, leg swelling and PND.  Gastrointestinal: Negative for abdominal pain, blood in stool, constipation, diarrhea, heartburn, melena and nausea.  Genitourinary: Negative for dysuria, frequency, hematuria and urgency.  Musculoskeletal: Negative for back pain, joint pain, myalgias and neck pain.  Skin: Negative for rash.  Neurological: Negative for dizziness, tingling, sensory change, focal weakness and headaches.  Endo/Heme/Allergies: Negative for environmental allergies and polydipsia. Does not bruise/bleed easily.  Psychiatric/Behavioral: Negative for depression and suicidal ideas. The patient is not nervous/anxious and does not have insomnia.      Objective  Vitals:   08/19/16 0804  BP: 120/82  Pulse: 80  Weight: 151 lb (68.5 kg)  Height: 5\' 5"  (1.651 m)    Physical Exam  Constitutional: She is well-developed, well-nourished, and in no distress. No distress.  HENT:  Head: Normocephalic and atraumatic.  Right Ear: External ear normal.  Left Ear: External ear normal.  Nose: Nose normal.  Mouth/Throat: Oropharynx is clear and moist.  Eyes: Conjunctivae and EOM are normal. Pupils are equal, round, and reactive to  light. Right eye exhibits no discharge. Left eye exhibits no discharge.  Neck: Normal range of motion. Neck supple. No JVD present. No thyromegaly present.  Cardiovascular: Normal rate, regular rhythm, normal heart sounds and intact distal pulses.  Exam reveals no gallop and no friction rub.   No murmur heard. Pulmonary/Chest: Effort normal and breath sounds normal.  She has no wheezes. She has no rales.  Abdominal: Soft. Bowel sounds are normal. She exhibits no mass. There is no tenderness. There is no rebound and no guarding.  Musculoskeletal: Normal range of motion. She exhibits no edema.  Lymphadenopathy:    She has no cervical adenopathy.  Neurological: She is alert.  Skin: Skin is warm and dry. She is not diaphoretic.  Psychiatric: Mood and affect normal.  Nursing note and vitals reviewed.     Assessment & Plan  Problem List Items Addressed This Visit      Cardiovascular and Mediastinum   Essential (primary) hypertension - Primary   Relevant Medications   amLODipine-benazepril (LOTREL) 5-10 MG capsule   Other Relevant Orders   Renal Function Panel        Dr. Elizabeth Sauereanna Ishita [facility]  08/19/16

## 2016-08-20 LAB — RENAL FUNCTION PANEL
Albumin: 4 g/dL (ref 3.5–5.5)
BUN/Creatinine Ratio: 17 (ref 9–23)
BUN: 11 mg/dL (ref 6–24)
CO2: 28 mmol/L (ref 18–29)
CREATININE: 0.64 mg/dL (ref 0.57–1.00)
Calcium: 9.1 mg/dL (ref 8.7–10.2)
Chloride: 101 mmol/L (ref 96–106)
GFR calc Af Amer: 115 mL/min/{1.73_m2} (ref >59)
GFR calc non Af Amer: 100 mL/min/{1.73_m2} (ref >59)
Glucose: 72 mg/dL (ref 65–99)
PHOSPHORUS: 3.8 mg/dL (ref 2.5–4.5)
Potassium: 4.2 mmol/L (ref 3.5–5.2)
SODIUM: 145 mmol/L — AB (ref 134–144)

## 2016-08-30 ENCOUNTER — Telehealth: Payer: Self-pay

## 2016-08-30 NOTE — Telephone Encounter (Signed)
Pt called and said she "Is seeing a PA at the spine center and want to see a Dr Henreitta LeberMoe Lim at the spine center"- wants Yetta BarreJones to "send a referral to see Dr Henreitta LeberMoe Lim"- please tell her she will need to request to see that Dr herself. As they are going to put her with PA at each visit unless they see fit to put her with the Dr."

## 2016-08-30 NOTE — Telephone Encounter (Signed)
PA and surgeon talked to her and came up with plan. She will do PT there and then [facility].

## 2016-08-30 NOTE — Telephone Encounter (Signed)
Pt said to call her at [phone]

## 2016-09-08 ENCOUNTER — Other Ambulatory Visit: Payer: Self-pay | Admitting: Family Medicine

## 2016-09-08 DIAGNOSIS — J301 Allergic rhinitis due to pollen: Secondary | ICD-10-CM

## 2016-09-09 ENCOUNTER — Other Ambulatory Visit: Payer: Self-pay

## 2016-10-24 ENCOUNTER — Ambulatory Visit: Payer: Self-pay | Admitting: Obstetrics and Gynecology

## 2016-11-10 ENCOUNTER — Other Ambulatory Visit: Payer: Self-pay | Admitting: Family Medicine

## 2016-11-10 DIAGNOSIS — J301 Allergic rhinitis due to pollen: Secondary | ICD-10-CM

## 2016-12-02 ENCOUNTER — Encounter: Payer: Self-pay | Admitting: Obstetrics and Gynecology

## 2016-12-02 ENCOUNTER — Ambulatory Visit (INDEPENDENT_AMBULATORY_CARE_PROVIDER_SITE_OTHER): Payer: BC Managed Care – PPO | Admitting: Obstetrics and Gynecology

## 2016-12-02 VITALS — BP 112/76 | HR 64 | Ht 65.5 in | Wt 155.0 lb

## 2016-12-02 DIAGNOSIS — N952 Postmenopausal atrophic vaginitis: Secondary | ICD-10-CM

## 2016-12-02 DIAGNOSIS — R87622 Low grade squamous intraepithelial lesion on cytologic smear of vagina (LGSIL): Secondary | ICD-10-CM | POA: Diagnosis not present

## 2016-12-02 DIAGNOSIS — Z1389 Encounter for screening for other disorder: Secondary | ICD-10-CM | POA: Diagnosis not present

## 2016-12-02 DIAGNOSIS — Z8619 Personal history of other infectious and parasitic diseases: Secondary | ICD-10-CM | POA: Insufficient documentation

## 2016-12-02 DIAGNOSIS — K640 First degree hemorrhoids: Secondary | ICD-10-CM

## 2016-12-02 DIAGNOSIS — Z01419 Encounter for gynecological examination (general) (routine) without abnormal findings: Secondary | ICD-10-CM | POA: Diagnosis not present

## 2016-12-02 DIAGNOSIS — Z1331 Encounter for screening for depression: Secondary | ICD-10-CM

## 2016-12-02 MED ORDER — ESTRADIOL 0.1 MG/GM VA CREA: 1.0000 g | TOPICAL_CREAM | VAGINAL | 2 refills | Status: DC

## 2016-12-02 MED ORDER — HYDROCORTISONE ACETATE 25 MG RE SUPP: 25.0000 mg | Freq: Two times a day (BID) | RECTAL | 3 refills | Status: DC

## 2016-12-02 NOTE — Progress Notes (Addendum)
Routine Annual Gynecology Examination   PCP: Elizabeth Sauer, MD  Chief Complaint  Patient presents with  . Gynecologic Exam    c/o vaginal dryness    History of Present Illness: Patient is a 56 y.o. G0P0000 presents for annual exam. The patient has no complaints today.   Menopausal bleeding: none due to hysterectomy  Menopausal symptoms: reports night sweats and vaginal dryness. She has not been using the topic estrogen due to her recent back issues (not sexually active)  Breast symptoms: denies  Last pap smear: 1 years ago.  Result abnormal.  She has no cervix. Colposcopy was normal.  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    Last mammogram: 3 months ago at [facility].  Result Normal   She reports a slipped disk about 6 or so months ago for which she had been undergoing intense physical therapy with good improvement in function overall, though she still has some pain.  She reports hemorrhoids and vaginal dryness.  She has not been using estrace cream due to not being sexually active.   Past Medical History:  Diagnosis Date  . Allergic rhinitis   . Hypertension   . Thyroid disease   . Toxic effect of mercury     Past Surgical History:  Procedure Laterality Date  . COLONOSCOPY  2016   Dr Luan Pulling @ [facility]- removed polyp  . VAGINAL HYSTERECTOMY      Medications:   Medication Sig Start Date End Date Taking? Authorizing Provider  amLODipine-benazepril (LOTREL) 5-10 MG capsule Take 1 capsule by mouth daily. 08/19/16  Yes Duanne Limerick, MD  Calcium Carbonate-Vitamin D (CALTRATE 600+D PO) Take 1 tablet by mouth daily at 6 (six) AM.   Yes Historical Provider, MD  ergocalciferol (VITAMIN D2) 50000 UNITS capsule Take 1 capsule by mouth every 14 (fourteen) days. Dr Sherryl Manges   Yes Historical Provider, MD  estradiol (ESTRACE VAGINAL) 0.1 MG/GM vaginal cream  07/13/15  Yes Historical Provider, MD  fexofenadine (ALLEGRA ALLERGY) 60 MG tablet Take 1 tablet by mouth daily at 6  (six) AM.   Yes Historical Provider, MD  fluticasone (FLONASE) 50 MCG/ACT nasal spray USE 1 SPRAY IN EACH NOSTRIL AS NEEDED 11/11/16  Yes Duanne Limerick, MD  levothyroxine (SYNTHROID) 137 MCG tablet  10/07/16  Yes Historical Provider, MD    Allergies  Allergen Reactions  . 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  Last mammogram: about 3-4 months ago. Results were: BiRads 1  Obstetric History: G0P0000  Social History   Social History  . Marital status: Married    Spouse name: N/A  . Number of children: N/A  . Years of education: N/A   Occupational History  . Not on file.   Social History Main Topics  . Smoking status: Never Smoker  . Smokeless tobacco: Never Used  . Alcohol use No  . Drug use: No  . Sexual activity: Not Currently   Other Topics Concern  . Not on file   Social History Narrative  . No narrative on file    Family History  Problem Relation Age of Onset  . Diabetes Father   . Heart disease Father   . Graves' disease Mother   . Thyroid disease Mother   . ALS Mother   . Hypertension Mother   . Thyroid  disease Brother   . Thyroid disease Maternal Grandmother     Review of Systems  Constitutional: Negative.   HENT: Negative.   Eyes: Negative.   Respiratory: Negative.   Cardiovascular: Negative.   Gastrointestinal: Negative.   Genitourinary: Negative.   Musculoskeletal: Negative.   Skin: Negative.   Neurological: Negative.   Psychiatric/Behavioral: Negative.      Physical Exam Vitals: BP 112/76   Pulse 64   Ht 5' 5.5" (1.664 m)   Wt 155 lb (70.3 kg)   SpO2 99%   BMI 25.40 kg/m   Physical Exam  Constitutional: She is oriented to person, place, and time and well-developed, well-nourished, and in no distress. No distress.  HENT:  Head: Normocephalic and atraumatic.  Eyes:  Conjunctivae are normal. Left eye exhibits no discharge. No scleral icterus.  Neck: Normal range of motion. Neck supple. No thyromegaly present.  Cardiovascular: Normal rate and regular rhythm.  Exam reveals no gallop and no friction rub.   No murmur heard. Pulmonary/Chest: Effort normal and breath sounds normal. No respiratory distress. She has no wheezes. She has no rales. Right breast exhibits no inverted nipple, no mass, no nipple discharge, no skin change and no tenderness. Left breast exhibits no inverted nipple, no mass, no nipple discharge, no skin change and no tenderness.  Abdominal: Soft. Bowel sounds are normal. She exhibits no distension and no mass. There is no tenderness. There is no rebound and no guarding.  Genitourinary: Vagina normal, right adnexa normal, left adnexa normal and vulva normal.  Genitourinary Comments: Cervix and uterus surgically absent  Musculoskeletal: Normal range of motion. She exhibits no edema.  Lymphadenopathy:    She has no cervical adenopathy.  Neurological: She is alert and oriented to person, place, and time. No cranial nerve deficit.  Skin: Skin is warm and dry. No rash noted.  Psychiatric: Judgment normal.   Female chaperone present for pelvic and breast  portions of the physical exam  Results: PHQ-9: 0   Assessment and Plan:  56 y.o. G0P0000 here for women's annual gynecologic examination, doing well overall. Plan: Problem List Items Addressed This Visit    LGSIL Pap smear of vagina   Relevant Orders   IGP, Aptima HPV, rfx 16/18,45    Other Visit Diagnoses    Women's annual routine gynecological examination    -  Primary   Relevant Medications   hydrocortisone (PROCTOSOL HC) 2.5 % rectal cream   Screening for depression       Atrophic vaginitis       Relevant Medications   estradiol (ESTRACE VAGINAL) 0.1 MG/GM vaginal cream   Grade I hemorrhoids       Relevant Medications   hydrocortisone (PROCTOSOL HC) 2.5 % rectal cream     1)  Mammogram  - recommend yearly screening mammogram. Up to date with this. Recommend another one next year after annual with me.   2) STI screening was not offered. Patient is very low risk.   3) Pap smear - Pap  Done today  4) Osteoporosis  - per USPTF routine screening DEXA at age 4  5) Routine healthcare maintenance including cholesterol, diabetes screening per PCP, Dr. Elizabeth Sauer.   6) Colonoscopy, per PCP  7) Follow up 1 year for routine annual  Thomasene Mohair, MD 12/04/2016 5:21 PM

## 2016-12-04 ENCOUNTER — Encounter: Payer: Self-pay | Admitting: Obstetrics and Gynecology

## 2016-12-04 MED ORDER — HYDROCORTISONE 2.5 % RE CREA: 1.0000 "application " | TOPICAL_CREAM | Freq: Two times a day (BID) | RECTAL | 1 refills | Status: AC

## 2016-12-04 NOTE — Addendum Note (Signed)
Addended by: Thomasene Mohair D on: 12/04/2016 05:21 PM   Modules accepted: Orders

## 2016-12-05 LAB — IGP, APTIMA HPV, RFX 16/18,45
HPV Aptima: NEGATIVE
PAP Smear Comment: 0

## 2016-12-18 ENCOUNTER — Encounter: Payer: Self-pay | Admitting: Obstetrics and Gynecology

## 2017-01-23 HISTORY — PX: BLEPHAROPLASTY: SUR158

## 2017-02-18 ENCOUNTER — Encounter: Payer: Self-pay | Admitting: Family Medicine

## 2017-02-18 ENCOUNTER — Ambulatory Visit (INDEPENDENT_AMBULATORY_CARE_PROVIDER_SITE_OTHER): Payer: BC Managed Care – PPO | Admitting: Family Medicine

## 2017-02-18 VITALS — BP 120/74 | HR 72 | Ht 65.0 in | Wt 157.0 lb

## 2017-02-18 DIAGNOSIS — J019 Acute sinusitis, unspecified: Secondary | ICD-10-CM | POA: Diagnosis not present

## 2017-02-18 DIAGNOSIS — J302 Other seasonal allergic rhinitis: Secondary | ICD-10-CM

## 2017-02-18 DIAGNOSIS — J301 Allergic rhinitis due to pollen: Secondary | ICD-10-CM | POA: Diagnosis not present

## 2017-02-18 DIAGNOSIS — H8301 Labyrinthitis, right ear: Secondary | ICD-10-CM | POA: Diagnosis not present

## 2017-02-18 MED ORDER — FLUTICASONE PROPIONATE 50 MCG/ACT NA SUSP: NASAL | 11 refills | Status: DC

## 2017-02-18 MED ORDER — AZITHROMYCIN 250 MG PO TABS: ORAL_TABLET | ORAL | 0 refills | Status: DC

## 2017-02-18 MED ORDER — PREDNISONE 10 MG PO TABS: 10.0000 mg | ORAL_TABLET | Freq: Every day | ORAL | 0 refills | Status: DC

## 2017-02-18 NOTE — Progress Notes (Signed)
Name: Deborah Vargas   MRN: [id number]    DOB: 05/03/1961   Date:02/18/2017       Progress Note  Subjective  Chief Complaint  Chief Complaint  Patient presents with  . Sinusitis    L) sided facial pressure, blurred vision, ear pain and dizziness    Sinusitis  This is a new problem. The current episode started in the past 7 days. The problem has been waxing and waning since onset. There has been no fever. The pain is mild. Associated symptoms include congestion, diaphoresis, ear pain, headaches, sinus pressure and swollen glands. Pertinent negatives include no chills, coughing, hoarse voice, neck pain, shortness of breath, sneezing or sore throat. (Vertigo) Past treatments include spray decongestants (flonase). The treatment provided mild relief.    No problem-specific Assessment & Plan notes found for this encounter.   Past Medical History:  Diagnosis Date  . Allergic rhinitis   . Hypertension   . Thyroid disease   . Toxic effect of mercury     Past Surgical History:  Procedure Laterality Date  . COLONOSCOPY  2016   [facility]  . VAGINAL HYSTERECTOMY      Family History  Problem Relation Age of Onset  . Diabetes Father   . Heart disease Father   . Graves' disease Mother   . Thyroid disease Mother   . ALS Mother   . Hypertension Mother   . Thyroid disease Brother   . Thyroid disease Maternal Grandmother     Social History   Social History  . Marital status: Married    Spouse name: N/A  . Number of children: N/A  . Years of education: N/A   Occupational History  . Not on file.   Social History Main Topics  . Smoking status: Never Smoker  . Smokeless tobacco: Never Used  . Alcohol use No  . Drug use: No  . Sexual activity: Not Currently   Other Topics Concern  . Not on file   Social History Narrative  . No narrative on file    Allergies  Allergen Reactions  . Diclofenac Other (See Comments)    Stiffness and bruising of the  knees    Outpatient Medications Prior to Visit  Medication Sig Dispense Refill  . amLODipine-benazepril (LOTREL) 5-10 MG capsule Take 1 capsule by mouth daily. 90 capsule 1  . Calcium Carbonate-Vitamin D (CALTRATE 600+D PO) Take 1 tablet by mouth daily at 6 (six) AM.    . ergocalciferol (VITAMIN D2) 50000 UNITS capsule Take 1 capsule by mouth every 14 (fourteen) days. Dr Sherryl MangesSprat    . estradiol (ESTRACE VAGINAL) 0.1 MG/GM vaginal cream Place 1 g vaginally 2 (two) times a week. 42.5 g 2  . fexofenadine (ALLEGRA ALLERGY) 60 MG tablet Take 1 tablet by mouth daily at 6 (six) AM.    . levothyroxine (SYNTHROID) 125 MCG tablet Dr Rosalita ChessmanSpratt    . fluticasone (FLONASE) 50 MCG/ACT nasal spray USE 1 SPRAY IN EACH NOSTRIL AS NEEDED 16 g 11   No facility-administered medications prior to visit.     Review of Systems  Constitutional: Positive for diaphoresis. Negative for chills, fever, malaise/fatigue and weight loss.  HENT: Positive for congestion, ear pain and sinus pressure. Negative for ear discharge, hoarse voice, sneezing and sore throat.   Eyes: Negative for blurred vision.  Respiratory: Negative for cough, sputum production, shortness of breath and wheezing.   Cardiovascular: Negative for chest pain, palpitations and leg swelling.  Gastrointestinal: Negative  for abdominal pain, blood in stool, constipation, diarrhea, heartburn, melena and nausea.  Genitourinary: Negative for dysuria, frequency, hematuria and urgency.  Musculoskeletal: Negative for back pain, joint pain, myalgias and neck pain.  Skin: Negative for rash.  Neurological: Positive for headaches. Negative for dizziness, tingling, sensory change and focal weakness.  Endo/Heme/Allergies: Negative for environmental allergies and polydipsia. Does not bruise/bleed easily.  Psychiatric/Behavioral: Negative for depression and suicidal ideas. The patient is not nervous/anxious and does not have insomnia.      Objective  Vitals:   02/18/17  0906  BP: 120/74  Pulse: 72  Weight: 157 lb (71.2 kg)  Height: 5\' 5"  (1.651 m)    Physical Exam  Constitutional: She is well-developed, well-nourished, and in no distress. No distress.  HENT:  Head: Normocephalic and atraumatic.  Right Ear: External ear normal.  Left Ear: External ear normal.  Nose: Nose normal.  Mouth/Throat: Oropharynx is clear and moist.  Eyes: Pupils are equal, round, and reactive to light. Conjunctivae and EOM are normal. Right eye exhibits no discharge. Left eye exhibits no discharge.  Neck: Normal range of motion. Neck supple. No JVD present. No thyromegaly present.  Cardiovascular: Normal rate, regular rhythm, normal heart sounds and intact distal pulses.  Exam reveals no gallop and no friction rub.   No murmur heard. Pulmonary/Chest: Effort normal. She has no wheezes. She has no rales.  Abdominal: Soft. Bowel sounds are normal. She exhibits no mass. There is no tenderness. There is no guarding.  Musculoskeletal: Normal range of motion. She exhibits no edema.  Lymphadenopathy:    She has no cervical adenopathy.  Neurological: She is alert. She has normal reflexes.  Skin: Skin is warm and dry. She is not diaphoretic.  Psychiatric: Mood and affect normal.  Nursing note and vitals reviewed.     Assessment & Plan  Problem List Items Addressed This Visit      Respiratory   Hay fever   Relevant Medications   fluticasone (FLONASE) 50 MCG/ACT nasal spray    Other Visit Diagnoses    Seasonal allergic rhinitis, unspecified trigger    -  Primary   Relevant Medications   predniSONE (DELTASONE) 10 MG tablet   Labyrinthitis of right ear       Relevant Medications   predniSONE (DELTASONE) 10 MG tablet   azithromycin (ZITHROMAX) 250 MG tablet   Acute non-recurrent sinusitis, unspecified location       Relevant Medications   predniSONE (DELTASONE) 10 MG tablet   azithromycin (ZITHROMAX) 250 MG tablet   fluticasone (FLONASE) 50 MCG/ACT nasal spray       Meds ordered this encounter  Medications  . predniSONE (DELTASONE) 10 MG tablet    Sig: Take 1 tablet (10 mg total) by mouth daily with breakfast.    Dispense:  30 tablet    Refill:  0    Taper 4,4,3,3,2,2,1,1  . azithromycin (ZITHROMAX) 250 MG tablet    Sig: 2 today then 1 a day for 4 days    Dispense:  6 tablet    Refill:  0  . fluticasone (FLONASE) 50 MCG/ACT nasal spray    Sig: USE 1 SPRAY IN EACH NOSTRIL AS NEEDED    Dispense:  16 g    Refill:  11      Dr. [facility]  02/18/17

## 2017-03-03 ENCOUNTER — Ambulatory Visit: Payer: BC Managed Care – PPO | Admitting: Family Medicine

## 2017-03-20 ENCOUNTER — Encounter: Payer: Self-pay | Admitting: Family Medicine

## 2017-03-31 ENCOUNTER — Ambulatory Visit: Payer: BC Managed Care – PPO | Admitting: Family Medicine

## 2017-04-01 ENCOUNTER — Encounter: Payer: Self-pay | Admitting: Family Medicine

## 2017-04-01 ENCOUNTER — Other Ambulatory Visit: Payer: Self-pay | Admitting: Family Medicine

## 2017-04-01 ENCOUNTER — Ambulatory Visit (INDEPENDENT_AMBULATORY_CARE_PROVIDER_SITE_OTHER): Payer: BC Managed Care – PPO | Admitting: Family Medicine

## 2017-04-01 VITALS — BP 102/64 | HR 65 | Temp 98.1°F | Resp 16 | Ht 65.5 in | Wt 157.2 lb

## 2017-04-01 DIAGNOSIS — Z1159 Encounter for screening for other viral diseases: Secondary | ICD-10-CM

## 2017-04-01 DIAGNOSIS — Z114 Encounter for screening for human immunodeficiency virus [HIV]: Secondary | ICD-10-CM | POA: Diagnosis not present

## 2017-04-01 DIAGNOSIS — I10 Essential (primary) hypertension: Secondary | ICD-10-CM

## 2017-04-01 MED ORDER — AMLODIPINE BESY-BENAZEPRIL HCL 5-10 MG PO CAPS: 1.0000 | ORAL_CAPSULE | Freq: Every day | ORAL | 1 refills | Status: DC

## 2017-04-01 NOTE — Progress Notes (Signed)
Name: Deborah Vargas   MRN: [id number]    DOB: Dec 27, 1960   Date:04/01/2017       Progress Note  Subjective  Chief Complaint  Chief Complaint  Patient presents with  . Blood Pressure Check    Hypertension  This is a chronic problem. The current episode started more than 1 year ago. The problem is unchanged. The problem is controlled. Pertinent negatives include no anxiety, blurred vision, chest pain, headaches, malaise/fatigue, neck pain, orthopnea, palpitations, peripheral edema, PND, shortness of breath or sweats. There are no associated agents to hypertension. Past treatments include ACE inhibitors and calcium channel blockers. The current treatment provides moderate improvement. There are no compliance problems.     No problem-specific Assessment & Plan notes found for this encounter.   Past Medical History:  Diagnosis Date  . Allergic rhinitis   . Hypertension   . Thyroid disease   . Toxic effect of mercury     Past Surgical History:  Procedure Laterality Date  . BLEPHAROPLASTY  01/23/2017   upper bilateral eye lids  . COLONOSCOPY  2016   Dr Luan Pulling @ [facility]- removed polyp  . VAGINAL HYSTERECTOMY      Family History  Problem Relation Age of Onset  . Diabetes Father   . Heart disease Father   . Graves' disease Mother   . Thyroid disease Mother   . ALS Mother   . Hypertension Mother   . Thyroid disease Brother   . Thyroid disease Maternal Grandmother     Social History   Social History  . Marital status: Married    Spouse name: N/A  . Number of children: N/A  . Years of education: N/A   Occupational History  . Not on file.   Social History Main Topics  . Smoking status: Never Smoker  . Smokeless tobacco: Never Used  . Alcohol use No  . Drug use: No  . Sexual activity: Not Currently   Other Topics Concern  . Not on file   Social History Narrative  . No narrative on file    Allergies  Allergen Reactions  . Diclofenac Other (See Comments)   Stiffness and bruising of the knees    Outpatient Medications Prior to Visit  Medication Sig Dispense Refill  . Calcium Carbonate-Vitamin D (CALTRATE 600+D PO) Take 1 tablet by mouth daily at 6 (six) AM.    . ergocalciferol (VITAMIN D2) 50000 UNITS capsule Take 1 capsule by mouth every 14 (fourteen) days. Dr Sherryl Manges    . estradiol (ESTRACE VAGINAL) 0.1 MG/GM vaginal cream Place 1 g vaginally 2 (two) times a week. 42.5 g 2  . fexofenadine (ALLEGRA ALLERGY) 60 MG tablet Take 1 tablet by mouth daily at 6 (six) AM.    . fluticasone (FLONASE) 50 MCG/ACT nasal spray USE 1 SPRAY IN EACH NOSTRIL AS NEEDED 16 g 11  . levothyroxine (SYNTHROID) 125 MCG tablet Dr Rosalita Chessman    . amLODipine-benazepril (LOTREL) 5-10 MG capsule Take 1 capsule by mouth daily. 90 capsule 1  . azithromycin (ZITHROMAX) 250 MG tablet 2 today then 1 a day for 4 days 6 tablet 0  . predniSONE (DELTASONE) 10 MG tablet Take 1 tablet (10 mg total) by mouth daily with breakfast. 30 tablet 0   No facility-administered medications prior to visit.     Review of Systems  Constitutional: Negative for chills, fever, malaise/fatigue and weight loss.  HENT: Negative for ear discharge, ear pain and sore throat.   Eyes: Negative for blurred vision.  Respiratory: Negative for cough, sputum production, shortness of breath and wheezing.   Cardiovascular: Negative for chest pain, palpitations, orthopnea, leg swelling and PND.  Gastrointestinal: Negative for abdominal pain, blood in stool, constipation, diarrhea, heartburn, melena and nausea.  Genitourinary: Negative for dysuria, frequency, hematuria and urgency.  Musculoskeletal: Negative for back pain, joint pain, myalgias and neck pain.  Skin: Negative for rash.  Neurological: Negative for dizziness, tingling, sensory change, focal weakness and headaches.  Endo/Heme/Allergies: Negative for environmental allergies and polydipsia. Does not bruise/bleed easily.  Psychiatric/Behavioral: Negative for  depression and suicidal ideas. The patient is not nervous/anxious and does not have insomnia.      Objective  Vitals:   04/01/17 0828  BP: 102/64  Pulse: 65  Resp: 16  Temp: 98.1 F (36.7 C)  TempSrc: Oral  SpO2: 98%  Weight: 157 lb 3.2 oz (71.3 kg)  Height: 5' 5.5" (1.664 m)    Physical Exam  Constitutional: She is well-developed, well-nourished, and in no distress. No distress.  HENT:  Head: Normocephalic and atraumatic.  Right Ear: External ear normal.  Left Ear: External ear normal.  Nose: Nose normal.  Mouth/Throat: Oropharynx is clear and moist.  Eyes: Pupils are equal, round, and reactive to light. Conjunctivae and EOM are normal. Right eye exhibits no discharge. Left eye exhibits no discharge.  Neck: Normal range of motion. Neck supple. No JVD present. No thyromegaly present.  Cardiovascular: Normal rate, regular rhythm, normal heart sounds and intact distal pulses.  Exam reveals no gallop and no friction rub.   No murmur heard. Pulmonary/Chest: Effort normal and breath sounds normal. She has no wheezes. She has no rales.  Abdominal: Soft. Bowel sounds are normal. She exhibits no mass. There is no tenderness. There is no guarding.  Musculoskeletal: Normal range of motion. She exhibits no edema.  Lymphadenopathy:    She has no cervical adenopathy.  Neurological: She is alert.  Skin: Skin is warm and dry. She is not diaphoretic.  Psychiatric: Mood and affect normal.  Nursing note and vitals reviewed.     Assessment & Plan  Problem List Items Addressed This Visit      Cardiovascular and Mediastinum   Essential (primary) hypertension - Primary   Relevant Medications   amLODipine-benazepril (LOTREL) 5-10 MG capsule   Other Relevant Orders   Renal Function Panel    Other Visit Diagnoses    Need for hepatitis C screening test       Relevant Orders   Hepatitis C antibody   Encounter for screening for HIV       Relevant Orders   HIV antibody      Meds  ordered this encounter  Medications  . amLODipine-benazepril (LOTREL) 5-10 MG capsule    Sig: Take 1 capsule by mouth daily.    Dispense:  90 capsule    Refill:  1      Dr. Elizabeth Sauer [facility]  04/01/17

## 2017-04-02 LAB — RENAL FUNCTION PANEL
Albumin: 4.3 g/dL (ref 3.5–5.5)
BUN / CREAT RATIO: 19 (ref 9–23)
BUN: 13 mg/dL (ref 6–24)
CALCIUM: 9.4 mg/dL (ref 8.7–10.2)
CO2: 25 mmol/L (ref 20–29)
CREATININE: 0.67 mg/dL (ref 0.57–1.00)
Chloride: 101 mmol/L (ref 96–106)
GFR calc Af Amer: 113 mL/min/{1.73_m2} (ref >59)
GFR calc non Af Amer: 98 mL/min/{1.73_m2} (ref >59)
Glucose: 74 mg/dL (ref 65–99)
Phosphorus: 3.8 mg/dL (ref 2.5–4.5)
Potassium: 4.2 mmol/L (ref 3.5–5.2)
Sodium: 143 mmol/L (ref 134–144)

## 2017-04-02 LAB — HIV ANTIBODY (ROUTINE TESTING W REFLEX): HIV SCREEN 4TH GENERATION: NONREACTIVE

## 2017-04-02 LAB — HEPATITIS C ANTIBODY

## 2017-08-25 ENCOUNTER — Ambulatory Visit: Payer: BC Managed Care – PPO | Admitting: Family Medicine

## 2017-08-25 ENCOUNTER — Encounter: Payer: Self-pay | Admitting: Family Medicine

## 2017-08-25 DIAGNOSIS — J301 Allergic rhinitis due to pollen: Secondary | ICD-10-CM

## 2017-08-25 DIAGNOSIS — I10 Essential (primary) hypertension: Secondary | ICD-10-CM | POA: Diagnosis not present

## 2017-08-25 MED ORDER — FLUTICASONE PROPIONATE 50 MCG/ACT NA SUSP: NASAL | 11 refills | Status: DC

## 2017-08-25 MED ORDER — AMLODIPINE BESY-BENAZEPRIL HCL 5-10 MG PO CAPS: 1.0000 | ORAL_CAPSULE | Freq: Every day | ORAL | 1 refills | Status: DC

## 2017-08-25 NOTE — Progress Notes (Signed)
Name: Deborah Vargas   MRN: [id number]    DOB: November 20, 1960   Date:08/25/2017       Progress Note  Subjective  Chief Complaint  Chief Complaint  Patient presents with  . Hypertension  . Allergic Rhinitis     flonase nasal spray refilled    Hypertension  This is a chronic problem. The current episode started more than 1 year ago. The problem has been gradually improving since onset. The problem is controlled. Pertinent negatives include no anxiety, blurred vision, chest pain, headaches, malaise/fatigue, neck pain, orthopnea, palpitations, peripheral edema, PND, shortness of breath or sweats. There are no associated agents to hypertension. There are no known risk factors for coronary artery disease. Past treatments include ACE inhibitors and calcium channel blockers. The current treatment provides moderate improvement. There are no compliance problems.  There is no history of angina, kidney disease, CAD/MI, CVA, heart failure, left ventricular hypertrophy, PVD or retinopathy. There is no history of chronic renal disease, a hypertension causing med or renovascular disease.    No problem-specific Assessment & Plan notes found for this encounter.   Past Medical History:  Diagnosis Date  . Allergic rhinitis   . Hypertension   . Thyroid disease   . Toxic effect of mercury     Past Surgical History:  Procedure Laterality Date  . BLEPHAROPLASTY  01/23/2017   upper bilateral eye lids  . COLONOSCOPY  2016   Dr Luan Pulling @ [facility]- removed polyp  . VAGINAL HYSTERECTOMY      Family History  Problem Relation Age of Onset  . Diabetes Father   . Heart disease Father   . Graves' disease Mother   . Thyroid disease Mother   . ALS Mother   . Hypertension Mother   . Thyroid disease Brother   . Thyroid disease Maternal Grandmother     Social History   Socioeconomic History  . Marital status: Married    Spouse name: Not on file  . Number of children: Not on file  . Years of education: Not  on file  . Highest education level: Not on file  Social Needs  . Financial resource strain: Not on file  . Food insecurity - worry: Not on file  . Food insecurity - inability: Not on file  . Transportation needs - medical: Not on file  . Transportation needs - non-medical: Not on file  Occupational History  . Not on file  Tobacco Use  . Smoking status: Never Smoker  . Smokeless tobacco: Never Used  Substance and Sexual Activity  . Alcohol use: No    Alcohol/week: 0.0 oz  . Drug use: No  . Sexual activity: Not Currently  Other Topics Concern  . Not on file  Social History Narrative  . Not on file    Allergies  Allergen Reactions  . Diclofenac Other (See Comments)    Stiffness and bruising of the knees    Outpatient Medications Prior to Visit  Medication Sig Dispense Refill  . Calcium Carbonate-Vitamin D (CALTRATE 600+D PO) Take 1 tablet by mouth daily at 6 (six) AM.    . ergocalciferol (VITAMIN D2) 50000 UNITS capsule Take 1 capsule by mouth every 14 (fourteen) days. Dr Sherryl Manges    . estradiol (ESTRACE VAGINAL) 0.1 MG/GM vaginal cream Place 1 g vaginally 2 (two) times a week. 42.5 g 2  . fexofenadine (ALLEGRA ALLERGY) 60 MG tablet Take 1 tablet by mouth daily at 6 (six) AM.    . levothyroxine (SYNTHROID) 125 MCG  tablet Dr Rosalita ChessmanSpratt    . amLODipine-benazepril (LOTREL) 5-10 MG capsule Take 1 capsule by mouth daily. 90 capsule 1  . fluticasone (FLONASE) 50 MCG/ACT nasal spray USE 1 SPRAY IN EACH NOSTRIL AS NEEDED 16 g 11   No facility-administered medications prior to visit.     Review of Systems  Constitutional: Negative for chills, fever, malaise/fatigue and weight loss.  HENT: Negative for ear discharge, ear pain and sore throat.   Eyes: Negative for blurred vision.  Respiratory: Negative for cough, sputum production, shortness of breath and wheezing.   Cardiovascular: Negative for chest pain, palpitations, orthopnea, leg swelling and PND.  Gastrointestinal: Negative for  abdominal pain, blood in stool, constipation, diarrhea, heartburn, melena and nausea.  Genitourinary: Negative for dysuria, frequency, hematuria and urgency.  Musculoskeletal: Negative for back pain, joint pain, myalgias and neck pain.  Skin: Negative for rash.  Neurological: Negative for dizziness, tingling, sensory change, focal weakness and headaches.  Endo/Heme/Allergies: Negative for environmental allergies and polydipsia. Does not bruise/bleed easily.  Psychiatric/Behavioral: Negative for depression and suicidal ideas. The patient is not nervous/anxious and does not have insomnia.      Objective  Vitals:   08/25/17 0816  BP: 120/68  Pulse: 72  Weight: 160 lb (72.6 kg)  Height: 5' 5.5" (1.664 m)    Physical Exam  Constitutional: She is well-developed, well-nourished, and in no distress. No distress.  HENT:  Head: Normocephalic and atraumatic.  Right Ear: External ear normal.  Left Ear: External ear normal.  Nose: Nose normal.  Mouth/Throat: Oropharynx is clear and moist.  Eyes: Conjunctivae and EOM are normal. Pupils are equal, round, and reactive to light. Right eye exhibits no discharge. Left eye exhibits no discharge.  Neck: Normal range of motion. Neck supple. No JVD present. No thyromegaly present.  Cardiovascular: Normal rate, regular rhythm, normal heart sounds and intact distal pulses. Exam reveals no gallop and no friction rub.  No murmur heard. Pulmonary/Chest: Effort normal and breath sounds normal. She has no wheezes. She has no rales.  Abdominal: Soft. Bowel sounds are normal. She exhibits no mass. There is no tenderness. There is no guarding.  Musculoskeletal: Normal range of motion. She exhibits no edema.  Lymphadenopathy:    She has no cervical adenopathy.  Neurological: She is alert.  Skin: Skin is warm and dry. She is not diaphoretic.  Psychiatric: Mood and affect normal.  Nursing note and vitals reviewed.     Assessment & Plan  Problem List Items  Addressed This Visit      Cardiovascular and Mediastinum   Essential (primary) hypertension   Relevant Medications   amLODipine-benazepril (LOTREL) 5-10 MG capsule     Respiratory   Hay fever   Relevant Medications   fluticasone (FLONASE) 50 MCG/ACT nasal spray      Meds ordered this encounter  Medications  . amLODipine-benazepril (LOTREL) 5-10 MG capsule    Sig: Take 1 capsule by mouth daily.    Dispense:  90 capsule    Refill:  1  . fluticasone (FLONASE) 50 MCG/ACT nasal spray    Sig: USE 1 SPRAY IN EACH NOSTRIL AS NEEDED    Dispense:  16 g    Refill:  11      Dr. Hayden Rasmusseneanna [facility]  08/25/17

## 2017-09-23 ENCOUNTER — Encounter: Payer: Self-pay | Admitting: Obstetrics and Gynecology

## 2017-12-03 ENCOUNTER — Ambulatory Visit: Payer: BC Managed Care – PPO | Admitting: Obstetrics and Gynecology

## 2018-01-16 ENCOUNTER — Encounter: Payer: Self-pay | Admitting: Family Medicine

## 2018-01-16 ENCOUNTER — Ambulatory Visit: Payer: BC Managed Care – PPO | Admitting: Family Medicine

## 2018-01-16 VITALS — BP 100/70 | HR 98 | Temp 99.0°F | Ht 65.5 in | Wt 157.0 lb

## 2018-01-16 DIAGNOSIS — J029 Acute pharyngitis, unspecified: Secondary | ICD-10-CM

## 2018-01-16 DIAGNOSIS — J4521 Mild intermittent asthma with (acute) exacerbation: Secondary | ICD-10-CM

## 2018-01-16 DIAGNOSIS — J0100 Acute maxillary sinusitis, unspecified: Secondary | ICD-10-CM

## 2018-01-16 MED ORDER — PREDNISONE 10 MG PO TABS: 10.0000 mg | ORAL_TABLET | Freq: Every day | ORAL | 0 refills | Status: DC

## 2018-01-16 MED ORDER — GUAIFENESIN-CODEINE 100-10 MG/5ML PO SYRP: 5.0000 mL | ORAL_SOLUTION | Freq: Three times a day (TID) | ORAL | 0 refills | Status: DC | PRN

## 2018-01-16 MED ORDER — AZITHROMYCIN 250 MG PO TABS: ORAL_TABLET | ORAL | 0 refills | Status: DC

## 2018-01-16 NOTE — Progress Notes (Signed)
Name: Deborah Vargas   MRN: [id number]    DOB: 12/07/1960   Date:01/16/2018       Progress Note  Subjective  Chief Complaint  Chief Complaint  Patient presents with  . Cough    cough is non- productive, but makes head hurt to cough. hoarseness, started instantly Wednesday night. low grade fever 99.7    Cough  This is a new problem. The current episode started in the past 7 days. The problem has been gradually worsening. The cough is non-productive. Associated symptoms include a fever, headaches, myalgias, nasal congestion, postnasal drip and rhinorrhea. Pertinent negatives include no chest pain, chills, ear congestion, ear pain, heartburn, hemoptysis, rash, sore throat, shortness of breath, sweats, weight loss or wheezing. Associated symptoms comments: Head pain with cough. The treatment provided moderate relief. There is no history of asthma, bronchitis, COPD or environmental allergies.    No problem-specific Assessment & Plan notes found for this encounter.   Past Medical History:  Diagnosis Date  . Allergic rhinitis   . Hypertension   . Thyroid disease   . Toxic effect of mercury     Past Surgical History:  Procedure Laterality Date  . BLEPHAROPLASTY  01/23/2017   upper bilateral eye lids  . COLONOSCOPY  2016   Dr Luan Pulling @ [facility]- removed polyp  . VAGINAL HYSTERECTOMY      Family History  Problem Relation Age of Onset  . Diabetes Father   . Heart disease Father   . Graves' disease Mother   . Thyroid disease Mother   . ALS Mother   . Hypertension Mother   . Thyroid disease Brother   . Thyroid disease Maternal Grandmother     Social History   Socioeconomic History  . Marital status: Married    Spouse name: Not on file  . Number of children: Not on file  . Years of education: Not on file  . Highest education level: Not on file  Occupational History  . Not on file  Social Needs  . Financial resource strain: Not on file  . Food insecurity:    Worry: Not on  file    Inability: Not on file  . Transportation needs:    Medical: Not on file    Non-medical: Not on file  Tobacco Use  . Smoking status: Never Smoker  . Smokeless tobacco: Never Used  Substance and Sexual Activity  . Alcohol use: No    Alcohol/week: 0.0 oz  . Drug use: No  . Sexual activity: Not Currently  Lifestyle  . Physical activity:    Days per week: Not on file    Minutes per session: Not on file  . Stress: Not on file  Relationships  . Social connections:    Talks on phone: Not on file    Gets together: Not on file    Attends religious service: Not on file    Active member of club or organization: Not on file    Attends meetings of clubs or organizations: Not on file    Relationship status: Not on file  . Intimate partner violence:    Fear of current or ex partner: Not on file    Emotionally abused: Not on file    Physically abused: Not on file    Forced sexual activity: Not on file  Other Topics Concern  . Not on file  Social History Narrative  . Not on file    Allergies  Allergen Reactions  . Diclofenac Other (See Comments)  Stiffness and bruising of the knees    Outpatient Medications Prior to Visit  Medication Sig Dispense Refill  . amLODipine-benazepril (LOTREL) 5-10 MG capsule Take 1 capsule by mouth daily. 90 capsule 1  . Calcium Carbonate-Vitamin D (CALTRATE 600+D PO) Take 1 tablet by mouth daily at 6 (six) AM.    . ergocalciferol (VITAMIN D2) 50000 UNITS capsule Take 1 capsule by mouth every 14 (fourteen) days. Dr Sherryl MangesSprat    . estradiol (ESTRACE VAGINAL) 0.1 MG/GM vaginal cream Place 1 g vaginally 2 (two) times a week. 42.5 g 2  . fexofenadine (ALLEGRA ALLERGY) 60 MG tablet Take 1 tablet by mouth daily at 6 (six) AM.    . fluticasone (FLONASE) 50 MCG/ACT nasal spray USE 1 SPRAY IN EACH NOSTRIL AS NEEDED 16 g 11  . levothyroxine (SYNTHROID) 125 MCG tablet Dr Rosalita ChessmanSpratt     No facility-administered medications prior to visit.     Review of Systems   Constitutional: Positive for fever. Negative for chills, malaise/fatigue and weight loss.  HENT: Positive for postnasal drip and rhinorrhea. Negative for ear discharge, ear pain and sore throat.   Eyes: Negative for blurred vision.  Respiratory: Positive for cough. Negative for hemoptysis, sputum production, shortness of breath and wheezing.   Cardiovascular: Negative for chest pain, palpitations and leg swelling.  Gastrointestinal: Negative for abdominal pain, blood in stool, constipation, diarrhea, heartburn, melena and nausea.  Genitourinary: Negative for dysuria, frequency, hematuria and urgency.  Musculoskeletal: Positive for myalgias. Negative for back pain, joint pain and neck pain.  Skin: Negative for rash.  Neurological: Positive for headaches. Negative for dizziness, tingling, sensory change and focal weakness.  Endo/Heme/Allergies: Negative for environmental allergies and polydipsia. Does not bruise/bleed easily.  Psychiatric/Behavioral: Negative for depression and suicidal ideas. The patient is not nervous/anxious and does not have insomnia.      Objective  Vitals:   01/16/18 1155  BP: 100/70  Pulse: 98  Temp: 99 F (37.2 C)  TempSrc: Oral  Weight: 157 lb (71.2 kg)  Height: 5' 5.5" (1.664 m)    Physical Exam  Constitutional: No distress.  HENT:  Head: Normocephalic and atraumatic.  Right Ear: Tympanic membrane and external ear normal.  Left Ear: Tympanic membrane and external ear normal.  Nose: Right sinus exhibits maxillary sinus tenderness. Left sinus exhibits maxillary sinus tenderness.  Mouth/Throat: Posterior oropharyngeal erythema present. No oropharyngeal exudate or posterior oropharyngeal edema.  Eyes: Pupils are equal, round, and reactive to light. Conjunctivae and EOM are normal. Right eye exhibits no discharge. Left eye exhibits no discharge.  Neck: Normal range of motion. Neck supple. No JVD present. No thyromegaly present.  Cardiovascular: Normal rate,  regular rhythm, normal heart sounds and intact distal pulses. Exam reveals no gallop and no friction rub.  No murmur heard. Pulmonary/Chest: Effort normal and breath sounds normal.  Abdominal: Soft. Bowel sounds are normal. She exhibits no mass. There is no tenderness. There is no guarding.  Musculoskeletal: Normal range of motion. She exhibits no edema.  Lymphadenopathy:    She has no cervical adenopathy.  Neurological: She is alert. She has normal reflexes.  Skin: Skin is warm and dry. She is not diaphoretic.  Nursing note and vitals reviewed.     Assessment & Plan  Problem List Items Addressed This Visit    None    Visit Diagnoses    Acute non-recurrent maxillary sinusitis    -  Primary   persistent postnasal drainage with otc relief. Will treat with azithroycin.   Relevant  Medications   azithromycin (ZITHROMAX) 250 MG tablet   guaiFENesin-codeine (ROBITUSSIN AC) 100-10 MG/5ML syrup   predniSONE (DELTASONE) 10 MG tablet   Pharyngitis, unspecified etiology       otc medications as needed   Mild intermittent reactive airway disease with acute exacerbation       Patient will start on prednisone taper for cough and airway reactivity. Robitussin for nighttime cough.   Relevant Medications   guaiFENesin-codeine (ROBITUSSIN AC) 100-10 MG/5ML syrup   predniSONE (DELTASONE) 10 MG tablet      Meds ordered this encounter  Medications  . azithromycin (ZITHROMAX) 250 MG tablet    Sig: 2 today then 1 a day for 4 days    Dispense:  6 tablet    Refill:  0  . guaiFENesin-codeine (ROBITUSSIN AC) 100-10 MG/5ML syrup    Sig: Take 5 mLs by mouth 3 (three) times daily as needed for cough.    Dispense:  150 mL    Refill:  0  . predniSONE (DELTASONE) 10 MG tablet    Sig: Take 1 tablet (10 mg total) by mouth daily with breakfast. Taper 4,4,3,3,2,2,1,1    Dispense:  30 tablet    Refill:  0      Dr. Hayden [facility]  01/16/18

## 2018-01-21 ENCOUNTER — Ambulatory Visit (INDEPENDENT_AMBULATORY_CARE_PROVIDER_SITE_OTHER): Payer: BC Managed Care – PPO | Admitting: Obstetrics and Gynecology

## 2018-01-21 ENCOUNTER — Encounter: Payer: Self-pay | Admitting: Obstetrics and Gynecology

## 2018-01-21 VITALS — BP 108/72 | HR 72 | Ht 65.5 in | Wt 163.0 lb

## 2018-01-21 DIAGNOSIS — N952 Postmenopausal atrophic vaginitis: Secondary | ICD-10-CM

## 2018-01-21 DIAGNOSIS — Z1339 Encounter for screening examination for other mental health and behavioral disorders: Secondary | ICD-10-CM

## 2018-01-21 DIAGNOSIS — Z1331 Encounter for screening for depression: Secondary | ICD-10-CM

## 2018-01-21 DIAGNOSIS — R232 Flushing: Secondary | ICD-10-CM | POA: Diagnosis not present

## 2018-01-21 DIAGNOSIS — Z124 Encounter for screening for malignant neoplasm of cervix: Secondary | ICD-10-CM

## 2018-01-21 DIAGNOSIS — Z01411 Encounter for gynecological examination (general) (routine) with abnormal findings: Secondary | ICD-10-CM

## 2018-01-21 DIAGNOSIS — Z01419 Encounter for gynecological examination (general) (routine) without abnormal findings: Secondary | ICD-10-CM

## 2018-01-21 LAB — HM PAP SMEAR: HM PAP: NORMAL

## 2018-01-21 MED ORDER — ESTRADIOL 0.1 MG/GM VA CREA: 1.0000 g | TOPICAL_CREAM | VAGINAL | 2 refills | Status: DC

## 2018-01-21 NOTE — Progress Notes (Signed)
Routine Annual Gynecology Examination   PCP: Duanne Limerick, MD  Chief Complaint  Patient presents with  . Gynecologic Exam    History of Present Illness: Patient is a 57 y.o. G0P0000 presents for annual exam. The patient has no complaints today.   Menopausal bleeding: none due to hysterectomy  Menopausal symptoms: hot flashes intermittently.  She states she still has vaginal dryness. She has itching.   Breast symptoms: denies  Last pap smear: 1 year ago.  Result Normal, the year prior to that the result was LGSIL (can't rule out high grade), colposcopy was normal.   Last mammogram: 4 months ago at [facility].  Result: BiRads 1  Recent diagnosis of laryngitis.  Starting to feel better.  Was given a Zpack and prednisone.    Past Medical History:  Diagnosis Date  . Allergic rhinitis   . Hypertension   . Thyroid disease   . Toxic effect of mercury     Past Surgical History:  Procedure Laterality Date  . BLEPHAROPLASTY  01/23/2017   upper bilateral eye lids  . COLONOSCOPY  2016   Dr Luan Pulling @ [facility]- removed polyp  . VAGINAL HYSTERECTOMY      Prior to Admission medications   Medication Sig Start Date End Date Taking? Authorizing Provider  amLODipine-benazepril (LOTREL) 5-10 MG capsule Take 1 capsule by mouth daily. 08/25/17  Yes Duanne Limerick, MD  Calcium Carbonate-Vitamin D (CALTRATE 600+D PO) Take 1 tablet by mouth daily at 6 (six) AM.   Yes [provider]  ergocalciferol (VITAMIN D2) 50000 UNITS capsule Take 1 capsule by mouth every 14 (fourteen) days. Dr Sherryl Manges   Yes [provider]  fluticasone (FLONASE) 50 MCG/ACT nasal spray USE 1 SPRAY IN EACH NOSTRIL AS NEEDED 08/25/17  Yes Duanne Limerick, MD  guaiFENesin-codeine (ROBITUSSIN AC) 100-10 MG/5ML syrup Take 5 mLs by mouth 3 (three) times daily as needed for cough. 01/16/18  Yes Duanne Limerick, MD  levothyroxine (SYNTHROID) 125 MCG tablet Dr Rosalita Chessman 10/07/16  Yes [provider]  predniSONE  (DELTASONE) 10 MG tablet Take 1 tablet (10 mg total) by mouth daily with breakfast. Taper 4,4,3,3,2,2,1,1 01/16/18  Yes Duanne Limerick, MD  estradiol (ESTRACE VAGINAL) 0.1 MG/GM vaginal cream Place 1 g vaginally 2 (two) times a week. Patient not taking: Reported on 01/21/2018 12/02/16   Conard Novak, MD  fexofenadine [facility] ALLERGY) 60 MG tablet Take 1 tablet by mouth daily at 6 (six) AM.    [provider]    Allergies  Allergen Reactions  . Diclofenac Other (See Comments)    Stiffness and bruising of the knees    Obstetric History: G0P0000  Social History   Socioeconomic History  . Marital status: Married    Spouse name: Not on file  . Number of children: Not on file  . Years of education: Not on file  . Highest education level: Not on file  Occupational History  . Not on file  Social Needs  . Financial resource strain: Not on file  . Food insecurity:    Worry: Not on file    Inability: Not on file  . Transportation needs:    Medical: Not on file    Non-medical: Not on file  Tobacco Use  . Smoking status: Never Smoker  . Smokeless tobacco: Never Used  Substance and Sexual Activity  . Alcohol use: No    Alcohol/week: 0.0 oz  . Drug use: No  . Sexual activity: Not Currently  Lifestyle  . Physical activity:    Days per week: Not on file    Minutes per session: Not on file  . Stress: Not on file  Relationships  . Social connections:    Talks on phone: Not on file    Gets together: Not on file    Attends religious service: Not on file    Active member of club or organization: Not on file    Attends meetings of clubs or organizations: Not on file    Relationship status: Not on file  . Intimate partner violence:    Fear of current or ex partner: Not on file    Emotionally abused: Not on file    Physically abused: Not on file    Forced sexual activity: Not on file  Other Topics Concern  . Not on file  Social History Narrative  . Not on file     Family History  Problem Relation Age of Onset  . Diabetes Father   . Heart disease Father   . Graves' disease Mother   . Thyroid disease Mother   . ALS Mother   . Hypertension Mother   . Thyroid disease Brother   . Thyroid disease Maternal Grandmother     Review of Systems  Constitutional: Negative.        Hot flashes  HENT: Positive for congestion and sore throat. Negative for ear discharge, ear pain, hearing loss, nosebleeds, sinus pain and tinnitus.   Eyes: Negative.   Respiratory: Positive for cough. Negative for hemoptysis, sputum production, shortness of breath, wheezing and stridor.   Cardiovascular: Negative.   Gastrointestinal: Negative.   Genitourinary: Negative.        Vaginal dryness and itching  Musculoskeletal: Negative.   Skin: Negative.   Neurological: Negative.   Psychiatric/Behavioral: Negative.      Physical Exam Vitals: BP 108/72 (BP Location: Left Arm, Patient Position: Sitting, Cuff Size: Normal)   Pulse 72   Ht 5' 5.5" (1.664 m)   Wt 163 lb (73.9 kg)   SpO2 99%   BMI 26.71 kg/m   Physical Exam  Constitutional: She is oriented to person, place, and time. She appears well-developed and well-nourished. No distress.  Genitourinary: Pelvic exam was performed with patient supine. There is no rash, tenderness, lesion or injury on the right labia. There is no rash, tenderness, lesion or injury on the left labia. No erythema, tenderness or bleeding in the vagina. No signs of injury around the vagina. No vaginal discharge found. Right adnexum does not display mass, does not display tenderness and does not display fullness. Left adnexum does not display mass, does not display tenderness and does not display fullness.  HENT:  Head: Normocephalic and atraumatic.  Eyes: EOM are normal. No scleral icterus.  Neck: Normal range of motion. Neck supple. No thyromegaly present.  Cardiovascular: Normal rate and regular rhythm. Exam reveals no gallop and no friction  rub.  No murmur heard. Pulmonary/Chest: Effort normal and breath sounds normal. No respiratory distress. She has no wheezes. She has no rales. Right breast exhibits no inverted nipple, no mass, no nipple discharge, no skin change and no tenderness. Left breast exhibits no inverted nipple, no mass, no nipple discharge, no skin change and no tenderness.  Abdominal: Soft. Bowel sounds are normal. She exhibits no distension and no mass. There is no tenderness. There is no rebound and no guarding.  Musculoskeletal: Normal range of motion. She exhibits no edema or tenderness.  Lymphadenopathy:    She  has no cervical adenopathy.       Right: No inguinal adenopathy present.       Left: No inguinal adenopathy present.  Neurological: She is alert and oriented to person, place, and time. No cranial nerve deficit.  Skin: Skin is warm and dry. No rash noted. No erythema.  Psychiatric: She has a normal mood and affect. Her behavior is normal. Judgment normal.     Female chaperone present for pelvic and breast  portions of the physical exam  Results: AUDIT Questionnaire (screen for alcoholism): 0 PHQ-9: 1   Assessment and Plan:  57 y.o. G0P0000 female here for routine annual gynecologic examination  Plan: Problem List Items Addressed This Visit      Genitourinary   Atrophic vaginitis   Relevant Medications   estradiol (ESTRACE VAGINAL) 0.1 MG/GM vaginal cream (Start on 01/22/2018)    Other Visit Diagnoses    Women's annual routine gynecological examination    -  Primary   Relevant Orders   IGP, Aptima HPV, rfx 16/18,45   Screening for depression       Screening for alcoholism       Pap smear for cervical cancer screening       Relevant Orders   IGP, Aptima HPV, rfx 16/18,45      Screening: -- Blood pressure screen normal -- Colonoscopy - per PCP -- Mammogram - not due -- Weight screening: normal -- Depression screening negative (PHQ-9) -- Nutrition: normal -- cholesterol screening:  per PCP -- osteoporosis screening: not due -- tobacco screening: not using -- alcohol screening: AUDIT questionnaire indicates low-risk usage. -- family history of breast cancer screening: done. not at high risk. -- no evidence of domestic violence or intimate partner violence. -- STD screening: gonorrhea/chlamydia NAAT not collected per patient request. -- pap smear collected per ASCCP guidelines -- HPV vaccination series: not eligilbe   Atrophic vaginitis: will prescribe topical estrogen cream, per patient request.   Thomasene MohairStephen Jackson, MD 01/21/2018 12:18 PM

## 2018-01-22 ENCOUNTER — Other Ambulatory Visit: Payer: Self-pay

## 2018-01-25 LAB — IGP, APTIMA HPV, RFX 16/18,45
HPV APTIMA: NEGATIVE
PAP Smear Comment: 0

## 2018-03-24 ENCOUNTER — Other Ambulatory Visit: Payer: Self-pay | Admitting: Family Medicine

## 2018-03-24 DIAGNOSIS — I10 Essential (primary) hypertension: Secondary | ICD-10-CM

## 2018-03-31 ENCOUNTER — Encounter: Payer: Self-pay | Admitting: Family Medicine

## 2018-03-31 ENCOUNTER — Ambulatory Visit: Payer: BC Managed Care – PPO | Admitting: Family Medicine

## 2018-03-31 VITALS — BP 120/80 | HR 68 | Ht 65.5 in | Wt 163.0 lb

## 2018-03-31 DIAGNOSIS — I10 Essential (primary) hypertension: Secondary | ICD-10-CM

## 2018-03-31 DIAGNOSIS — J301 Allergic rhinitis due to pollen: Secondary | ICD-10-CM | POA: Diagnosis not present

## 2018-03-31 MED ORDER — AMLODIPINE BESY-BENAZEPRIL HCL 5-10 MG PO CAPS: 1.0000 | ORAL_CAPSULE | Freq: Every day | ORAL | 1 refills | Status: DC

## 2018-03-31 MED ORDER — FLUTICASONE PROPIONATE 50 MCG/ACT NA SUSP: NASAL | 11 refills | Status: DC

## 2018-03-31 NOTE — Assessment & Plan Note (Signed)
Recurrent Stable Continue Flonase prn allergic rhinitis sxs.

## 2018-03-31 NOTE — Assessment & Plan Note (Signed)
Chronic Controlled Continue LOTREL 5-10 mg daily. Check renal panel.

## 2018-03-31 NOTE — Progress Notes (Signed)
LOTREL 5-Name: Deborah Vargas   MRN: [id number]    DOB: 05/04/1961   Date:03/31/2018       Progress Note  Subjective  Chief Complaint  Chief Complaint  Patient presents with  . Hypertension    follow up b/p  . Allergic Rhinitis     refill flonase    Hypertension  This is a chronic problem. The current episode started more than 1 year ago. The problem is unchanged. The problem is controlled. Pertinent negatives include no anxiety, blurred vision, chest pain, headaches, malaise/fatigue, neck pain, orthopnea, palpitations, peripheral edema, PND, shortness of breath or sweats. There are no associated agents to hypertension. Past treatments include calcium channel blockers and ACE inhibitors. The current treatment provides mild improvement. There are no compliance problems.  There is no history of angina, kidney disease, CAD/MI, CVA, heart failure, left ventricular hypertrophy, PVD or retinopathy. There is no history of chronic renal disease, a hypertension causing med or renovascular disease.    Essential (primary) hypertension Chronic Controlled Continue LOTREL 5-10 mg daily. Check renal panel.  Hay fever Recurrent Stable Continue Flonase prn allergic rhinitis sxs.   Past Medical History:  Diagnosis Date  . Allergic rhinitis   . Hypertension   . Thyroid disease   . Toxic effect of mercury     Past Surgical History:  Procedure Laterality Date  . BLEPHAROPLASTY  01/23/2017   upper bilateral eye lids  . COLONOSCOPY  2016   [facility]- [facility]  . VAGINAL HYSTERECTOMY      Family History  Problem Relation Age of Onset  . Diabetes Father   . Heart disease Father   . Graves' disease Mother   . Thyroid disease Mother   . ALS Mother   . Hypertension Mother   . Thyroid disease Brother   . Thyroid disease Maternal Grandmother     Social History   Socioeconomic History  . Marital status: Married    Spouse name: Not on file  . Number of children: Not on file   . Years of education: Not on file  . Highest education level: Not on file  Occupational History  . Not on file  Social Needs  . Financial resource strain: Not on file  . Food insecurity:    Worry: Not on file    Inability: Not on file  . Transportation needs:    Medical: Not on file    Non-medical: Not on file  Tobacco Use  . Smoking status: Never Smoker  . Smokeless tobacco: Never Used  Substance and Sexual Activity  . Alcohol use: No    Alcohol/week: 0.0 standard drinks  . Drug use: No  . Sexual activity: Not Currently  Lifestyle  . Physical activity:    Days per week: Not on file    Minutes per session: Not on file  . Stress: Not on file  Relationships  . Social connections:    Talks on phone: Not on file    Gets together: Not on file    Attends religious service: Not on file    Active member of club or organization: Not on file    Attends meetings of clubs or organizations: Not on file    Relationship status: Not on file  . Intimate partner violence:    Fear of current or ex partner: Not on file    Emotionally abused: Not on file    Physically abused: Not on file    Forced sexual activity: Not on  file  Other Topics Concern  . Not on file  Social History Narrative  . Not on file    Allergies  Allergen Reactions  . Diclofenac Other (See Comments)    Stiffness and bruising of the knees    Outpatient Medications Prior to Visit  Medication Sig Dispense Refill  . Calcium Carbonate-Vitamin D (CALTRATE 600+D PO) Take 1 tablet by mouth daily at 6 (six) AM.    . ergocalciferol (VITAMIN D2) 50000 UNITS capsule Take 1 capsule by mouth every 14 (fourteen) days. Dr Sherryl Manges    . estradiol (ESTRACE VAGINAL) 0.1 MG/GM vaginal cream Place 1 g vaginally 2 (two) times a week. 42.5 g 2  . fexofenadine (ALLEGRA ALLERGY) 60 MG tablet Take 1 tablet by mouth daily at 6 (six) AM.    . levothyroxine (SYNTHROID) 125 MCG tablet Dr Rosalita Chessman    . amLODipine-benazepril (LOTREL) 5-10 MG  capsule TAKE 1 CAPSULE BY MOUTH DAILY 90 capsule 0  . fluticasone (FLONASE) 50 MCG/ACT nasal spray USE 1 SPRAY IN EACH NOSTRIL AS NEEDED 16 g 11  . guaiFENesin-codeine (ROBITUSSIN AC) 100-10 MG/5ML syrup Take 5 mLs by mouth 3 (three) times daily as needed for cough. 150 mL 0  . predniSONE (DELTASONE) 10 MG tablet Take 1 tablet (10 mg total) by mouth daily with breakfast. Taper 4,4,3,3,2,2,1,1 30 tablet 0   No facility-administered medications prior to visit.     Review of Systems  Constitutional: Negative for chills, fever, malaise/fatigue and weight loss.  HENT: Negative for ear discharge, ear pain and sore throat.   Eyes: Negative for blurred vision.  Respiratory: Negative for cough, sputum production, shortness of breath and wheezing.   Cardiovascular: Negative for chest pain, palpitations, orthopnea, leg swelling and PND.  Gastrointestinal: Negative for abdominal pain, blood in stool, constipation, diarrhea, heartburn, melena and nausea.  Genitourinary: Negative for dysuria, frequency, hematuria and urgency.  Musculoskeletal: Negative for back pain, joint pain, myalgias and neck pain.  Skin: Negative for rash.  Neurological: Negative for dizziness, tingling, sensory change, focal weakness and headaches.  Endo/Heme/Allergies: Negative for environmental allergies and polydipsia. Does not bruise/bleed easily.  Psychiatric/Behavioral: Negative for depression and suicidal ideas. The patient is not nervous/anxious and does not have insomnia.      Objective  Vitals:   03/31/18 0805  BP: 120/80  Pulse: 68  Weight: 163 lb (73.9 kg)  Height: 5' 5.5" (1.664 m)    Physical Exam  Constitutional: No distress.  HENT:  Head: Normocephalic and atraumatic.  Right Ear: External ear normal.  Left Ear: External ear normal.  Nose: Nose normal.  Mouth/Throat: Oropharynx is clear and moist.  Eyes: Pupils are equal, round, and reactive to light. Conjunctivae and EOM are normal. Right eye  exhibits no discharge. Left eye exhibits no discharge.  Neck: Normal range of motion. Neck supple. No JVD present. No thyromegaly present.  Cardiovascular: Normal rate, regular rhythm, normal heart sounds and intact distal pulses. Exam reveals no gallop and no friction rub.  No murmur heard. Pulmonary/Chest: Effort normal and breath sounds normal. No stridor. She has no wheezes. She has no rales.  Abdominal: Soft. Bowel sounds are normal. She exhibits no mass. There is no tenderness. There is no guarding.  Musculoskeletal: Normal range of motion. She exhibits no edema.  Lymphadenopathy:    She has no cervical adenopathy.  Neurological: She is alert. She has normal reflexes.  Skin: Skin is warm and dry. She is not diaphoretic.  Nursing note and vitals reviewed.  Assessment & Plan  Problem List Items Addressed This Visit      Cardiovascular and Mediastinum   Essential (primary) hypertension - Primary    Chronic Controlled Continue LOTREL 5-10 mg daily. Check renal panel.      Relevant Medications   amLODipine-benazepril (LOTREL) 5-10 MG capsule   Other Relevant Orders   Renal Function Panel     Other   Hay fever    Recurrent Stable Continue Flonase prn allergic rhinitis sxs.      Relevant Medications   fluticasone (FLONASE) 50 MCG/ACT nasal spray      Meds ordered this encounter  Medications  . amLODipine-benazepril (LOTREL) 5-10 MG capsule    Sig: Take 1 capsule by mouth daily.    Dispense:  90 capsule    Refill:  1    Needs med refill  . fluticasone (FLONASE) 50 MCG/ACT nasal spray    Sig: USE 1 SPRAY IN EACH NOSTRIL AS NEEDED    Dispense:  16 g    Refill:  11      Dr. Hayden [facility]  03/31/18

## 2018-04-01 LAB — RENAL FUNCTION PANEL
ALBUMIN: 4.4 g/dL (ref 3.5–5.5)
BUN/Creatinine Ratio: 17 (ref 9–23)
BUN: 13 mg/dL (ref 6–24)
CALCIUM: 9.3 mg/dL (ref 8.7–10.2)
CHLORIDE: 103 mmol/L (ref 96–106)
CO2: 25 mmol/L (ref 20–29)
Creatinine, Ser: 0.77 mg/dL (ref 0.57–1.00)
GFR calc Af Amer: 98 mL/min/{1.73_m2} (ref >59)
GFR calc non Af Amer: 85 mL/min/{1.73_m2} (ref >59)
Glucose: 75 mg/dL (ref 65–99)
PHOSPHORUS: 3.8 mg/dL (ref 2.5–4.5)
POTASSIUM: 4.1 mmol/L (ref 3.5–5.2)
Sodium: 144 mmol/L (ref 134–144)

## 2018-04-14 ENCOUNTER — Ambulatory Visit: Payer: BC Managed Care – PPO | Admitting: Family Medicine

## 2018-06-28 ENCOUNTER — Other Ambulatory Visit: Payer: Self-pay | Admitting: Family Medicine

## 2018-06-28 DIAGNOSIS — I10 Essential (primary) hypertension: Secondary | ICD-10-CM

## 2018-09-10 ENCOUNTER — Encounter: Payer: Self-pay | Admitting: Family Medicine

## 2018-09-10 ENCOUNTER — Ambulatory Visit: Payer: BC Managed Care – PPO | Admitting: Family Medicine

## 2018-09-10 DIAGNOSIS — J301 Allergic rhinitis due to pollen: Secondary | ICD-10-CM | POA: Diagnosis not present

## 2018-09-10 DIAGNOSIS — I10 Essential (primary) hypertension: Secondary | ICD-10-CM | POA: Diagnosis not present

## 2018-09-10 MED ORDER — FLUTICASONE PROPIONATE 50 MCG/ACT NA SUSP: NASAL | 6 refills | Status: AC

## 2018-09-10 MED ORDER — AMLODIPINE BESY-BENAZEPRIL HCL 5-10 MG PO CAPS: 1.0000 | ORAL_CAPSULE | Freq: Every day | ORAL | 1 refills | Status: AC

## 2018-09-10 NOTE — Progress Notes (Signed)
Date:  09/10/2018   Name:  Deborah Vargas   DOB:  [birth year]   MRN:  [id number]   Chief Complaint: Hypertension and Allergic Rhinitis   Hypertension  This is a chronic problem. The current episode started more than 1 year ago. The problem is unchanged. The problem is controlled. Pertinent negatives include no anxiety, blurred vision, chest pain, headaches, malaise/fatigue, neck pain, orthopnea, palpitations, peripheral edema, PND, shortness of breath or sweats. There are no associated agents to hypertension. Risk factors for coronary artery disease include dyslipidemia and diabetes mellitus. Past treatments include ACE inhibitors and calcium channel blockers. The current treatment provides moderate improvement. There are no compliance problems.  There is no history of angina, kidney disease, CAD/MI, CVA, heart failure, left ventricular hypertrophy or PVD. There is no history of chronic renal disease, a hypertension causing med or renovascular disease.    Review of Systems  Constitutional: Negative.  Negative for chills, fatigue, fever, malaise/fatigue and unexpected weight change.  HENT: Negative for congestion, ear discharge, ear pain, rhinorrhea, sinus pressure, sneezing and sore throat.   Eyes: Negative for blurred vision, photophobia, pain, discharge, redness and itching.  Respiratory: Negative for cough, shortness of breath, wheezing and stridor.   Cardiovascular: Negative for chest pain, palpitations, orthopnea and PND.  Gastrointestinal: Negative for abdominal pain, blood in stool, constipation, diarrhea, nausea and vomiting.  Endocrine: Negative for cold intolerance, heat intolerance, polydipsia, polyphagia and polyuria.  Genitourinary: Negative for dysuria, flank pain, frequency, hematuria, menstrual problem, pelvic pain, urgency, vaginal bleeding and vaginal discharge.  Musculoskeletal: Negative for arthralgias, back pain, myalgias and neck pain.  Skin: Negative for rash.    Allergic/Immunologic: Negative for environmental allergies and food allergies.  Neurological: Negative for dizziness, weakness, light-headedness, numbness and headaches.  Hematological: Negative for adenopathy. Does not bruise/bleed easily.  Psychiatric/Behavioral: Negative for dysphoric mood. The patient is not nervous/anxious.     Patient Active Problem List   Diagnosis Date Noted  . Atrophic vaginitis 01/21/2018  . History of shingles 12/02/2016  . LGSIL Pap smear of vagina 12/02/2016  . 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 History of nonmelanoma skin cancer 09/01/2015  . 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 Hay fever 03/14/2015  . Essential (primary) hypertension 03/14/2015  . Personal history of infectious and parasitic disease 03/14/2015  . H/O nutritional disorder 03/14/2015  . Thyroid disease 03/14/2015  . Osteopenia of the elderly 03/14/2015    Allergies  Allergen Reactions  . Diclofenac Other (See Comments)    Stiffness and bruising of the knees    Past Surgical History:  Procedure Laterality Date  . BLEPHAROPLASTY  01/23/2017   upper bilateral eye lids  . COLONOSCOPY  2016   Dr Luan Pulling @ [facility]- removed polyp  . VAGINAL HYSTERECTOMY      Social History   Tobacco Use  . Smoking status: Never Smoker  . Smokeless tobacco: Never Used  Substance Use Topics  . Alcohol use: No    Alcohol/week: 0.0 standard drinks  . Drug use: No     Medication list has been reviewed and updated.  Current Meds  Medication Sig  . amLODipine-benazepril (LOTREL) 5-10 MG capsule Take 1 capsule by mouth daily.  . Calcium Carbonate-Vitamin D (CALTRATE 600+D PO) Take 1 tablet by mouth daily at 6 (six) AM.  . ergocalciferol (VITAMIN D2) 50000 UNITS capsule Take 1 capsule by mouth every 14 (fourteen) days. Dr Sherryl Manges  . fexofenadine (ALLEGRA ALLERGY) 60 MG tablet Take 1 tablet by mouth  daily at 6 (six) AM.  . fluticasone (FLONASE) 50 MCG/ACT nasal spray USE 1  SPRAY IN EACH NOSTRIL AS NEEDED  . levothyroxine (SYNTHROID) 125 MCG tablet Dr Rosalita ChessmanSpratt    [facility] 01/21/2018 08/25/2017 09/12/2015  PHQ - 2 Score 0 0 0  PHQ- 9 Score 1 0 -    Physical Exam Vitals signs and nursing note reviewed.  Constitutional:      General: She is not in acute distress.    Appearance: She is not diaphoretic.  HENT:     Head: Normocephalic and atraumatic.     Right Ear: Tympanic membrane, ear canal and external ear normal.     Left Ear: Tympanic membrane, ear canal and external ear normal.     Nose: Nose normal.  Eyes:     General:        Right eye: No discharge.        Left eye: No discharge.     Conjunctiva/sclera: Conjunctivae normal.     Pupils: Pupils are equal, round, and reactive to light.  Neck:     Musculoskeletal: Normal range of motion and neck supple.     Thyroid: No thyromegaly.     Vascular: No JVD.  Cardiovascular:     Rate and Rhythm: Normal rate and regular rhythm.     Heart sounds: Normal heart sounds. No murmur. No friction rub. No gallop.   Pulmonary:     Effort: Pulmonary effort is normal. No respiratory distress.     Breath sounds: Normal breath sounds. No stridor. No wheezing, rhonchi or rales.  Chest:     Chest wall: No tenderness.  Abdominal:     General: Bowel sounds are normal.     Palpations: Abdomen is soft. There is no mass.     Tenderness: There is no abdominal tenderness. There is no guarding.  Musculoskeletal: Normal range of motion.  Lymphadenopathy:     Cervical: No cervical adenopathy.  Skin:    General: Skin is warm and dry.  Neurological:     Mental Status: She is alert.     Deep Tendon Reflexes: Reflexes are normal and symmetric.     BP 120/78   Pulse 80   Ht 5' 5.5" (1.664 m)   Wt 167 lb (75.8 kg)   BMI 27.37 kg/m   Assessment and Plan: 1. Essential (primary) hypertension Chronic.  Controlled.  Will continue Lotrel 5-10 mg daily.  Reviewed most recent renal function panels and these are within  normal limits at this time. - amLODipine-benazepril (LOTREL) 5-10 MG capsule; Take 1 capsule by mouth daily.  Dispense: 90 capsule; Refill: 1  2. Hay fever Patient has allergic rhinitis that is seasonal which is controlled with fluticasone nasal spray. - fluticasone (FLONASE) 50 MCG/ACT nasal spray; USE 1 SPRAY IN EACH NOSTRIL AS NEEDED  Dispense: 16 g; Refill: 6

## 2018-10-06 ENCOUNTER — Ambulatory Visit: Payer: BC Managed Care – PPO | Admitting: Family Medicine

## 2018-10-21 ENCOUNTER — Ambulatory Visit: Payer: BC Managed Care – PPO | Admitting: Family Medicine

## 2018-11-01 ENCOUNTER — Encounter: Payer: Self-pay | Admitting: Family Medicine

## 2019-02-13 ENCOUNTER — Other Ambulatory Visit: Payer: Self-pay

## 2019-02-13 ENCOUNTER — Ambulatory Visit (INDEPENDENT_AMBULATORY_CARE_PROVIDER_SITE_OTHER): Payer: BC Managed Care – PPO

## 2019-02-13 ENCOUNTER — Encounter: Payer: Self-pay | Admitting: Emergency Medicine

## 2019-02-13 ENCOUNTER — Ambulatory Visit
Admission: EM | Admit: 2019-02-13 | Discharge: 2019-02-13 | Disposition: A | Discharge status: Home / Self Care | Payer: BC Managed Care – PPO | Attending: Family Medicine | Admitting: Family Medicine

## 2019-02-13 DIAGNOSIS — W208XXA Other cause of strike by thrown, projected or falling object, initial encounter: Secondary | ICD-10-CM

## 2019-02-13 DIAGNOSIS — S90212A Contusion of left great toe with damage to nail, initial encounter: Secondary | ICD-10-CM

## 2019-02-13 DIAGNOSIS — M79675 Pain in left toe(s): Secondary | ICD-10-CM

## 2019-02-13 MED ORDER — CEPHALEXIN 500 MG PO CAPS: 500.0000 mg | ORAL_CAPSULE | Freq: Two times a day (BID) | ORAL | 0 refills | Status: DC

## 2019-02-13 MED ORDER — HYDROCODONE-ACETAMINOPHEN 5-325 MG PO TABS: ORAL_TABLET | ORAL | 0 refills | Status: DC

## 2019-02-13 NOTE — ED Triage Notes (Signed)
Pt dropped a piece of plywood on her left great toe. She spilt the toe nail. She has contacted her podiatrist and hopes to see them on Monday. Dr. Elvina Mattes told her to come here for x rays. This occurred about 2 hours ago.

## 2019-02-13 NOTE — Discharge Instructions (Signed)
Follow up with Podiatry on Monday

## 2019-02-13 NOTE — ED Provider Notes (Signed)
[facility]    CSN: [id number] Arrival date & time: 02/13/19  1516     History   Chief Complaint Chief Complaint  Patient presents with  . Toe Injury    left great toe    HPI Deborah Vargas is a 58 y.o. female.   58 yo female with a c/o injury to left great toe about 2 hours ago after accidentally dropping at piece of plywood on it.  States she immediately saw blood accumulation underneath the nail and she pressed on her nail to express the blood. States she sees podiatry regularly and contacted them for an appointment on Monday.      Past Medical History:  Diagnosis Date  . Allergic rhinitis   . Hypertension   . Thyroid disease   . Toxic effect of mercury     Patient Active Problem List   Diagnosis Date Noted  . Atrophic vaginitis 01/21/2018  . History of shingles 12/02/2016  . LGSIL Pap smear of vagina 12/02/2016  . 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 History of nonmelanoma skin cancer 09/01/2015  . 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 Hay fever 03/14/2015  . Essential (primary) hypertension 03/14/2015  . Personal history of infectious and parasitic disease 03/14/2015  . H/O nutritional disorder 03/14/2015  . Thyroid disease 03/14/2015  . Osteopenia of the elderly 03/14/2015    Past Surgical History:  Procedure Laterality Date  . BLEPHAROPLASTY  01/23/2017   upper bilateral eye lids  . COLONOSCOPY  2016   [facility]  . VAGINAL HYSTERECTOMY      OB History    Gravida  0   Para  0   Term  0   Preterm  0   AB  0   Living  0     SAB  0   TAB  0   Ectopic  0   Multiple  0   Live Births  0            Home Medications    Prior to Admission medications   Medication Sig Start Date End Date Taking? Authorizing Provider  amLODipine-benazepril (LOTREL) 5-10 MG capsule Take 1 capsule by mouth daily. 09/10/18  Yes Duanne LimerickJones, Deanna C, MD  Calcium Carbonate-Vitamin D (CALTRATE  600+D PO) Take 1 tablet by mouth daily at 6 (six) AM.   Yes [provider]  ergocalciferol (VITAMIN D2) 50000 UNITS capsule Take 1 capsule by mouth every 14 (fourteen) days. Dr Sherryl MangesSprat   Yes [provider]  estradiol (ESTRACE VAGINAL) 0.1 MG/GM vaginal cream Place 1 g vaginally 2 (two) times a week. 01/22/18  Yes Conard NovakJackson, Stephen D, MD  fexofenadine [facility](ALLEGRA ALLERGY) 60 MG tablet Take 1 tablet by mouth daily at 6 (six) AM.   Yes [provider]  fluticasone (FLONASE) 50 MCG/ACT nasal spray USE 1 SPRAY IN EACH NOSTRIL AS NEEDED 09/10/18  Yes Duanne LimerickJones, Deanna C, MD  levothyroxine (SYNTHROID) 125 MCG tablet Dr Rosalita ChessmanSpratt 10/07/16  Yes [provider]  cephALEXin (KEFLEX) 500 MG capsule Take 1 capsule (500 mg total) by mouth 2 (two) times daily. 02/13/19   Payton Mccallumonty, Makaylen Thieme, MD  HYDROcodone-acetaminophen (NORCO/VICODIN) 5-325 MG tablet 1-2 tabs po qd prn 02/13/19   Payton Mccallumonty, Britiany Silbernagel, MD    Family History Family History  Problem Relation Age of Onset  . Diabetes Father   . Heart disease Father   . Graves' disease Mother   . Thyroid disease Mother   . ALS  Mother   . Hypertension Mother   . Thyroid disease Brother   . Thyroid disease Maternal Grandmother     Social History Social History   Tobacco Use  . Smoking status: Never Smoker  . Smokeless tobacco: Never Used  Substance Use Topics  . Alcohol use: No    Alcohol/week: 0.0 standard drinks  . Drug use: No     Allergies   Diclofenac   Review of Systems Review of Systems   Physical Exam Triage Vital Signs ED Triage Vitals  Enc Vitals Group     BP 02/13/19 1526 124/62     Pulse Rate 02/13/19 1526 92     Resp 02/13/19 1526 18     Temp 02/13/19 1526 98 F (36.7 C)     Temp Source 02/13/19 1526 Oral     SpO2 02/13/19 1526 98 %     Weight 02/13/19 1527 167 lb (75.8 kg)     Height 02/13/19 1527 5' 5.5" (1.664 m)     Head Circumference --      Peak Flow --      Pain Score 02/13/19 1527 0     Pain Loc --       Pain Edu? --      Excl. in GC? --    No data found.  Updated Vital Signs BP 124/62 (BP Location: Left Arm)   Pulse 92   Temp 98 F (36.7 C) (Oral)   Resp 18   Ht 5' 5.5" (1.664 m)   Wt 75.8 kg   SpO2 98%   BMI 27.37 kg/m   Visual Acuity Right Eye Distance:   Left Eye Distance:   Bilateral Distance:    Right Eye Near:   Left Eye Near:    Bilateral Near:     Physical Exam Vitals signs and nursing note reviewed.  Constitutional:      General: She is not in acute distress.    Appearance: She is not toxic-appearing or diaphoretic.  Musculoskeletal:     Left foot: Normal capillary refill. Tenderness present. No swelling.     Comments: Left great toenail split but attached at distal end; otherwise nail intact; no blood under nail; foot neurovascularly intact  Neurological:     Mental Status: She is alert.      UC Treatments / Results  Labs (all labs ordered are listed, but only abnormal results are displayed) Labs Reviewed - No data to display  EKG   Radiology Dg Toe Great Left  Result Date: 02/13/2019 CLINICAL DATA:  Crush injury left great toe today. Initial encounter. EXAM: LEFT GREAT TOE COMPARISON:  None. FINDINGS: There is no evidence of fracture or dislocation. There is no evidence of arthropathy or other focal bone abnormality. Soft tissues are unremarkable. IMPRESSION: Negative exam. Electronically Signed   By: Drusilla Kannerhomas  Dalessio M.D.   On: 02/13/2019 15:45    Procedures Procedures (including critical care time)  Medications Ordered in UC Medications - No data to display  Initial Impression / Assessment and Plan / UC Course  I have reviewed the triage vital signs and the nursing notes.  Pertinent labs & imaging results that were available during my care of the patient were reviewed by me and considered in my medical decision making (see chart for details).      Final Clinical Impressions(s) / UC Diagnoses   Final diagnoses:  Contusion of left  great toe with damage to nail, initial encounter     Discharge Instructions  Follow up with Podiatry on Monday    ED Prescriptions    Medication Sig Dispense Auth. Provider   cephALEXin (KEFLEX) 500 MG capsule Take 1 capsule (500 mg total) by mouth 2 (two) times daily. 14 capsule Norval Gable, MD   HYDROcodone-acetaminophen (NORCO/VICODIN) 5-325 MG tablet 1-2 tabs po qd prn 6 tablet Norval Gable, MD     1. x-ray result (negative for fracture) and diagnosis reviewed with patient 2. rx as per orders above; reviewed possible side effects, interactions, risks and benefits  3. Recommend supportive treatment routine wound care  4. Follow-up with podiatrist on Monday 5. Follow up prn . Controlled Substance Prescriptions [place] Controlled Substance Registry consulted? Not Applicable   Norval Gable, MD 02/13/19 [phone]

## 2019-03-11 ENCOUNTER — Ambulatory Visit: Payer: BC Managed Care – PPO | Admitting: Family Medicine

## 2019-11-23 ENCOUNTER — Ambulatory Visit
Admission: EM | Admit: 2019-11-23 | Discharge: 2019-11-23 | Disposition: A | Discharge status: Home / Self Care | Payer: BC Managed Care – PPO | Attending: Family Medicine | Admitting: Family Medicine

## 2019-11-23 ENCOUNTER — Other Ambulatory Visit: Payer: Self-pay

## 2019-11-23 ENCOUNTER — Encounter: Payer: Self-pay | Admitting: Emergency Medicine

## 2019-11-23 DIAGNOSIS — S61431A Puncture wound without foreign body of right hand, initial encounter: Secondary | ICD-10-CM

## 2019-11-23 DIAGNOSIS — Z23 Encounter for immunization: Secondary | ICD-10-CM

## 2019-11-23 MED ORDER — DOXYCYCLINE HYCLATE 100 MG PO CAPS: 100.0000 mg | ORAL_CAPSULE | Freq: Two times a day (BID) | ORAL | 0 refills | Status: DC

## 2019-11-23 MED ORDER — TETANUS-DIPHTH-ACELL PERTUSSIS 5-2.5-18.5 LF-MCG/0.5 IM SUSP
0.5000 mL | Freq: Once | INTRAMUSCULAR | Status: AC
  Administered 2019-11-23: 15:00:00 0.5 mL via INTRAMUSCULAR

## 2019-11-23 NOTE — ED Provider Notes (Signed)
[facility]    CSN: [id number] Arrival date & time: 11/23/19  1434      History   Chief Complaint Chief Complaint  Patient presents with  . foreign body in hand   HPI  59 year old female presents with a puncture wound to her right hand.  Patient reports that she was cleaning a window and as she was doing so a piece of wood around the window frame pierced her skin of her right hand.  She was able to get the piece of wood out.  It seems to be intact.  She reports mild pain currently.  No bleeding.  Area was cleansed with Betadine.  She is unsure of her last tetanus.  She does not feel that she has a retained foreign body.  No other associated symptoms.  No other complaints.  Past Medical History:  Diagnosis Date  . Allergic rhinitis   . Hypertension   . Thyroid disease   . Toxic effect of mercury     Patient Active Problem List   Diagnosis Date Noted  . Atrophic vaginitis 01/21/2018  . History of shingles 12/02/2016  . LGSIL Pap smear of vagina 12/02/2016  . 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 History of nonmelanoma skin cancer 09/01/2015  . 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 Hay fever 03/14/2015  . Essential (primary) hypertension 03/14/2015  . Personal history of infectious and parasitic disease 03/14/2015  . H/O nutritional disorder 03/14/2015  . Thyroid disease 03/14/2015  . Osteopenia of the elderly 03/14/2015    Past Surgical History:  Procedure Laterality Date  . BLEPHAROPLASTY  01/23/2017   upper bilateral eye lids  . COLONOSCOPY  2016   Dr Denice Paradise @ [facility]- removed polyp  . VAGINAL HYSTERECTOMY      OB History    Gravida  0   Para  0   Term  0   Preterm  0   AB  0   Living  0     SAB  0   TAB  0   Ectopic  0   Multiple  0   Live Births  0            Home Medications    Prior to Admission medications   Medication Sig Start Date End Date Taking? Authorizing Provider  amLODipine-benazepril  (LOTREL) 5-10 MG capsule Take 1 capsule by mouth daily. 09/10/18  Yes Juline Patch, MD  Calcium Carbonate-Vitamin D (CALTRATE 600+D PO) Take 1 tablet by mouth daily at 6 (six) AM.   Yes [provider]  ergocalciferol (VITAMIN D2) 50000 UNITS capsule Take 1 capsule by mouth every 14 (fourteen) days. Dr Michiel Sites   Yes [provider]  fexofenadine (ALLEGRA ALLERGY) 60 MG tablet Take 1 tablet by mouth daily at 6 (six) AM.   Yes [provider]  fluticasone (FLONASE) 50 MCG/ACT nasal spray USE 1 SPRAY IN EACH NOSTRIL AS NEEDED 09/10/18  Yes Juline Patch, MD  levothyroxine (SYNTHROID) 125 MCG tablet Dr Frederik Pear 10/07/16  Yes [provider]  doxycycline (VIBRAMYCIN) 100 MG capsule Take 1 capsule (100 mg total) by mouth 2 (two) times daily. 11/23/19   Coral Spikes, DO    Family History Family History  Problem Relation Age of Onset  . Diabetes Father   . Heart disease Father   . Graves' disease Mother   . Thyroid disease Mother   . ALS Mother   . Hypertension Mother   . Thyroid disease Brother   .  Thyroid disease Maternal Grandmother     Social History Social History   Tobacco Use  . Smoking status: Never Smoker  . Smokeless tobacco: Never Used  Substance Use Topics  . Alcohol use: No    Alcohol/week: 0.0 standard drinks  . Drug use: No     Allergies   Diclofenac   Review of Systems Review of Systems  Constitutional: Negative.   Skin:       Puncture wound.   Physical Exam Triage Vital Signs ED Triage Vitals  Enc Vitals Group     BP 11/23/19 1449 126/72     Pulse Rate 11/23/19 1449 78     Resp 11/23/19 1449 18     Temp 11/23/19 1449 98 F (36.7 C)     Temp Source 11/23/19 1449 Oral     SpO2 11/23/19 1449 100 %     Weight 11/23/19 1450 157 lb (71.2 kg)     Height 11/23/19 1450 5\' 5"  (1.651 m)     Head Circumference --      Peak Flow --      Pain Score 11/23/19 1449 2     Pain Loc --      Pain Edu? --      Excl. in GC? --     Updated Vital Signs BP 126/72 (BP Location: Left Arm)   Pulse 78   Temp 98 F (36.7 C) (Oral)   Resp 18   Ht 5\' 5"  (1.651 m)   Wt 71.2 kg   SpO2 100%   BMI 26.13 kg/m   Visual Acuity Right Eye Distance:   Left Eye Distance:   Bilateral Distance:    Right Eye Near:   Left Eye Near:    Bilateral Near:     Physical Exam Vitals and nursing note reviewed.  Constitutional:      General: She is not in acute distress.    Appearance: Normal appearance. She is not ill-appearing.  HENT:     Head: Normocephalic and atraumatic.  Eyes:     General:        Right eye: No discharge.        Left eye: No discharge.     Conjunctiva/sclera: Conjunctivae normal.  Pulmonary:     Effort: Pulmonary effort is normal. No respiratory distress.  Skin:    Comments: Right hand with a small puncture wound noted just below the MCP joint of the fifth digit on the palmar aspect.  Neurological:     Mental Status: She is alert.  Psychiatric:        Mood and Affect: Mood normal.        Behavior: Behavior normal.    UC Treatments / Results  Labs (all labs ordered are listed, but only abnormal results are displayed) Labs Reviewed - No data to display  EKG   Radiology No results found.  Procedures Procedures (including critical care time)  Medications Ordered in UC Medications  Tdap (BOOSTRIX) injection 0.5 mL (0.5 mLs Intramuscular Given 11/23/19 1502)    Initial Impression / Assessment and Plan / UC Course  I have reviewed the triage vital signs and the nursing notes.  Pertinent labs & imaging results that were available during my care of the patient were reviewed by me and considered in my medical decision making (see chart for details).    59 year old female presents with a puncture wound.  No appreciable foreign body.  Area was cleaned and antibiotic ointment and Band-Aid was applied.  No evidence  of infection at this time.  Wait-and-see prescription given for doxycycline.   Supportive care.  Tetanus given today.  Final Clinical Impressions(s) / UC Diagnoses   Final diagnoses:  Puncture wound of right hand without foreign body, initial encounter     Discharge Instructions     Keep clean.  Antibiotic if you develop signs of infection.  Take care  Dr. Adriana Simas    ED Prescriptions    Medication Sig Dispense Auth. Provider   doxycycline (VIBRAMYCIN) 100 MG capsule Take 1 capsule (100 mg total) by mouth 2 (two) times daily. 14 capsule Everlene Other G, DO     PDMP not reviewed this encounter.   Tommie Sams, [place] 11/23/19 1532

## 2019-11-23 NOTE — ED Triage Notes (Signed)
Patient states she got a piece of wood stuck in her right hand today while washing windows.

## 2019-11-23 NOTE — Discharge Instructions (Signed)
Keep clean.  Antibiotic if you develop signs of infection.  Take care  Dr. Adriana Simas

## 2020-12-14 ENCOUNTER — Encounter: Payer: Self-pay | Admitting: Obstetrics and Gynecology

## 2020-12-14 ENCOUNTER — Other Ambulatory Visit: Payer: Self-pay

## 2020-12-14 ENCOUNTER — Ambulatory Visit (INDEPENDENT_AMBULATORY_CARE_PROVIDER_SITE_OTHER): Payer: BC Managed Care – PPO | Admitting: Obstetrics and Gynecology

## 2020-12-14 VITALS — BP 100/80 | Ht 65.5 in | Wt 170.0 lb

## 2020-12-14 DIAGNOSIS — K640 First degree hemorrhoids: Secondary | ICD-10-CM

## 2020-12-14 DIAGNOSIS — N898 Other specified noninflammatory disorders of vagina: Secondary | ICD-10-CM | POA: Diagnosis not present

## 2020-12-14 LAB — POCT WET PREP WITH KOH
Clue Cells Wet Prep HPF POC: NEGATIVE
KOH Prep POC: NEGATIVE
Trichomonas, UA: NEGATIVE
Yeast Wet Prep HPF POC: NEGATIVE

## 2020-12-14 MED ORDER — HYDROCORTISONE (PERIANAL) 2.5 % EX CREA: 1.0000 "application " | TOPICAL_CREAM | Freq: Two times a day (BID) | CUTANEOUS | 0 refills | Status: AC

## 2020-12-14 MED ORDER — CLOTRIMAZOLE-BETAMETHASONE 1-0.05 % EX CREA
TOPICAL_CREAM | CUTANEOUS | 0 refills | Status: DC
Start: 2020-12-14 — End: 2022-03-11

## 2020-12-14 NOTE — Progress Notes (Signed)
Herschell Dimes, FNP   Chief Complaint  Patient presents with  . Vaginal Itching    Irritation, no discharge or odor x 3 weeks    HPI:      Ms. Deborah Vargas is a 60 y.o. G0P0000 whose LMP was No LMP recorded. Patient has had a hysterectomy., presents today for extreme vaginal itching for the past few wks. Vaginal tissue feels dry. Treated with monistat-7 externally without relief. Is scratching regularly and has cut tissue. No increased vag d/c or odor. Uses scented soap vaginally and washes vigorously. Occas uses dryer sheets, no wipes. Wears cotton underwear. Used to use vaginal ERT and has tried a little externally. She is not sex active, no PMB.  Needs RF on hemorrhoid crm.  Has annual next wk with DR. Jean Rosenthal  Past Medical History:  Diagnosis Date  . Allergic rhinitis   . Hypertension   . Thyroid disease   . Toxic effect of mercury     Past Surgical History:  Procedure Laterality Date  . BLEPHAROPLASTY  01/23/2017   upper bilateral eye lids  . CHOLECYSTECTOMY    . COLONOSCOPY  2016   Dr Luan Pulling @ [facility]- removed polyp  . VAGINAL HYSTERECTOMY      Family History  Problem Relation Age of Onset  . Diabetes Father   . Heart disease Father   . Alzheimer's disease Father   . Graves' disease Mother   . Thyroid disease Mother   . ALS Mother   . Hypertension Mother   . Thyroid disease Brother   . Thyroid disease Maternal Grandmother     Social History   Socioeconomic History  . Marital status: Married    Spouse name: Not on file  . Number of children: Not on file  . Years of education: Not on file  . Highest education level: Not on file  Occupational History  . Not on file  Tobacco Use  . Smoking status: Never Smoker  . Smokeless tobacco: Never Used  Vaping Use  . Vaping Use: Never used  Substance and Sexual Activity  . Alcohol use: No    Alcohol/week: 0.0 standard drinks  . Drug use: No  . Sexual activity: Not Currently  Other Topics Concern  .  Not on file  Social History Narrative  . Not on file   Social Determinants of Health   Financial Resource Strain: Not on file  Food Insecurity: Not on file  Transportation Needs: Not on file  Physical Activity: Not on file  Stress: Not on file  Social Connections: Not on file  Intimate Partner Violence: Not on file    Outpatient Medications Prior to Visit  Medication Sig Dispense Refill  . amLODipine-benazepril (LOTREL) 5-10 MG capsule Take 1 capsule by mouth daily. 90 capsule 1  . ergocalciferol (VITAMIN D2) 50000 UNITS capsule Take 1 capsule by mouth every 14 (fourteen) days. Dr Sherryl Manges    . fexofenadine (ALLEGRA) 60 MG tablet Take 1 tablet by mouth daily at 6 (six) AM.    . fluticasone (FLONASE) 50 MCG/ACT nasal spray USE 1 SPRAY IN EACH NOSTRIL AS NEEDED 16 g 6  . levothyroxine (SYNTHROID) 125 MCG tablet Dr Rosalita Chessman    . Calcium Carbonate-Vitamin D (CALTRATE 600+D PO) Take 1 tablet by mouth daily at 6 (six) AM. (Patient not taking: Reported on 12/14/2020)    . doxycycline (VIBRAMYCIN) 100 MG capsule Take 1 capsule (100 mg total) by mouth 2 (two) times daily. (Patient not taking: Reported on  12/14/2020) 14 capsule 0   No facility-administered medications prior to visit.      ROS:  Review of Systems  Constitutional: Negative for fever.  Gastrointestinal: Negative for blood in stool, constipation, diarrhea, nausea and vomiting.  Genitourinary: Negative for dyspareunia, dysuria, flank pain, frequency, hematuria, urgency, vaginal bleeding, vaginal discharge and vaginal pain.  Musculoskeletal: Negative for back pain.  Skin: Negative for rash.   BREAST: No symptoms   OBJECTIVE:   Vitals:  BP 100/80   Ht 5' 5.5" (1.664 m)   Wt 170 lb (77.1 kg)   BMI 27.86 kg/m   Physical Exam Vitals reviewed.  Constitutional:      Appearance: She is well-developed.  Pulmonary:     Effort: Pulmonary effort is normal.  Genitourinary:    Pubic Area: No rash.      Labia:        Right:  Lesion present. No rash or tenderness.        Left: Lesion present. No rash or tenderness.      Vagina: Normal. No vaginal discharge, erythema or tenderness.     Uterus: Absent.      Adnexa: Right adnexa normal and left adnexa normal.       Right: No mass or tenderness.         Left: No mass or tenderness.       Comments: BILAT LABIA MINORA WITH A FEW EXCORIATIONS; NO ERYTHEMA, D/C, ULCERATIVE LESIONS; NO EVID OF LS Musculoskeletal:        General: Normal range of motion.     Cervical back: Normal range of motion.  Skin:    General: Skin is warm and dry.  Neurological:     General: No focal deficit present.     Mental Status: She is alert and oriented to person, place, and time.  Psychiatric:        Mood and Affect: Mood normal.        Behavior: Behavior normal.        Thought Content: Thought content normal.        Judgment: Judgment normal.     Results: Results for orders placed or performed in visit on 12/14/20 (from the past 24 hour(s))  POCT Wet Prep with KOH     Status: Normal   Collection Time: 12/14/20 11:56 AM  Result Value Ref Range   Trichomonas, UA Negative    Clue Cells Wet Prep HPF POC neg    Epithelial Wet Prep HPF POC     Yeast Wet Prep HPF POC neg    Bacteria Wet Prep HPF POC     RBC Wet Prep HPF POC     WBC Wet Prep HPF POC     KOH Prep POC Negative Negative     Assessment/Plan: Vaginal itching - Plan: clotrimazole-betamethasone (LOTRISONE) cream, POCT Wet Prep with KOH; neg exam, neg wet prep. Question fungal vs chem derm vs itch/scratch. Rx lotrisone crm BID prn sx, cool compresses to stop itch, don't scratch; Dove sens skin soap and wash gently. F/u with SDJ next wk.  Grade I hemorrhoids - Plan: hydrocortisone (PROCTOSOL HC) 2.5 % rectal cream; Rx RF    Meds ordered this encounter  Medications  . clotrimazole-betamethasone (LOTRISONE) cream    Sig: Apply externally BID prn sx up to 2 wks    Dispense:  15 g    Refill:  0    Order Specific  Question:   Supervising Provider    Answer:   Nadara Mustard [  [id number]]  . hydrocortisone (PROCTOSOL HC) 2.5 % rectal cream    Sig: Place 1 application rectally 2 (two) times daily.    Dispense:  30 g    Refill:  0    Order Specific Question:   Supervising Provider    Answer:   Nadara Mustard [[id number]]      Return if symptoms worsen or fail to improve.  Ahmet Schank B. Freedom Peddy, PA-C 12/14/2020 11:56 AM

## 2020-12-14 NOTE — Patient Instructions (Signed)
I value your feedback and you entrusting us with your care. If you get a [facility] patient survey, I would appreciate you taking the time to let us know about your experience today. Thank you! ? ? ?

## 2020-12-20 ENCOUNTER — Ambulatory Visit (INDEPENDENT_AMBULATORY_CARE_PROVIDER_SITE_OTHER): Payer: BC Managed Care – PPO | Admitting: Obstetrics and Gynecology

## 2020-12-20 ENCOUNTER — Ambulatory Visit: Payer: BC Managed Care – PPO | Admitting: Obstetrics and Gynecology

## 2020-12-20 ENCOUNTER — Encounter: Payer: Self-pay | Admitting: Obstetrics and Gynecology

## 2020-12-20 ENCOUNTER — Other Ambulatory Visit: Payer: Self-pay

## 2020-12-20 VITALS — BP 123/77 | Ht 65.5 in | Wt 168.0 lb

## 2020-12-20 DIAGNOSIS — Z1331 Encounter for screening for depression: Secondary | ICD-10-CM | POA: Diagnosis not present

## 2020-12-20 DIAGNOSIS — Z01419 Encounter for gynecological examination (general) (routine) without abnormal findings: Secondary | ICD-10-CM | POA: Diagnosis not present

## 2020-12-20 DIAGNOSIS — Z1339 Encounter for screening examination for other mental health and behavioral disorders: Secondary | ICD-10-CM

## 2020-12-20 DIAGNOSIS — N952 Postmenopausal atrophic vaginitis: Secondary | ICD-10-CM

## 2020-12-20 MED ORDER — ESTRADIOL 0.1 MG/GM VA CREA: 1.0000 | TOPICAL_CREAM | VAGINAL | 3 refills | Status: AC

## 2020-12-20 NOTE — Progress Notes (Signed)
Routine Annual Gynecology Examination   PCP: Herschell Dimes, FNP  Chief Complaint  Patient presents with  . Annual Exam    History of Present Illness: Patient is a 60 y.o. G0P0000 presents for annual exam. The patient has no complaints today.   Menopausal bleeding: denies  Menopausal symptoms: reports vaginal dryness  Breast symptoms: denies  Last pap smear: 01/21/2018 years ago.  Result Normal  Last mammogram: 11/06/2020.  Result Normal  Past Medical History:  Diagnosis Date  . Allergic rhinitis   . Hypertension   . Thyroid disease   . Toxic effect of mercury     Past Surgical History:  Procedure Laterality Date  . BLEPHAROPLASTY  01/23/2017   upper bilateral eye lids  . CHOLECYSTECTOMY    . COLONOSCOPY  2016   Dr Luan Pulling @ [facility], [place]- removed polyp  . VAGINAL HYSTERECTOMY      Prior to Admission medications   Medication Sig Start Date End Date Taking? Authorizing Provider  amLODipine-benazepril (LOTREL) 5-10 MG capsule Take 1 capsule by mouth daily. 09/10/18  Yes Duanne Limerick, MD  Calcium Carbonate-Vitamin D (CALTRATE 600+D PO) Take 1 tablet by mouth daily at 6 (six) AM.   Yes [provider]  clotrimazole-betamethasone (LOTRISONE) cream Apply externally BID prn sx up to 2 wks 12/14/20  Yes Copland, Helmut Muster B, PA-C  doxycycline (VIBRAMYCIN) 100 MG capsule Take 1 capsule (100 mg total) by mouth 2 (two) times daily. 11/23/19  Yes Cook, Jayce G, DO  ergocalciferol (VITAMIN D2) 50000 UNITS capsule Take 1 capsule by mouth every 14 (fourteen) days. Dr Sherryl Manges   Yes [provider]  fexofenadine (ALLEGRA) 60 MG tablet Take 1 tablet by mouth daily at 6 (six) AM.   Yes [provider]  fluticasone (FLONASE) 50 MCG/ACT nasal spray USE 1 SPRAY IN EACH NOSTRIL AS NEEDED 09/10/18  Yes Duanne Limerick, MD  hydrocortisone (PROCTOSOL HC) 2.5 % rectal cream Place 1 application rectally 2 (two) times daily. 12/14/20  Yes Copland, Ilona Sorrel, PA-C  levothyroxine (SYNTHROID)  125 MCG tablet Dr Rosalita Chessman 10/07/16  Yes [provider]    Allergies  Allergen Reactions  . Diclofenac Other (See Comments)    Stiffness and bruising of the knees   Obstetric History: G0P0000  Social History   Socioeconomic History  . Marital status: Married    Spouse name: Not on file  . Number of children: Not on file  . Years of education: Not on file  . Highest education level: Not on file  Occupational History  . Not on file  Tobacco Use  . Smoking status: Never Smoker  . Smokeless tobacco: Never Used  Vaping Use  . Vaping Use: Never used  Substance and Sexual Activity  . Alcohol use: No    Alcohol/week: 0.0 standard drinks  . Drug use: No  . Sexual activity: Not Currently  Other Topics Concern  . Not on file  Social History Narrative  . Not on file   Social Determinants of Health   Financial Resource Strain: Not on file  Food Insecurity: Not on file  Transportation Needs: Not on file  Physical Activity: Not on file  Stress: Not on file  Social Connections: Not on file  Intimate Partner Violence: Not on file    Family History  Problem Relation Age of Onset  . Diabetes Father   . Heart disease Father   . Alzheimer's disease Father   . Graves' disease Mother   . Thyroid disease Mother   .  ALS Mother   . Hypertension Mother   . Thyroid disease Brother   . Thyroid disease Maternal Grandmother     Review of Systems  Constitutional: Negative.   HENT: Negative.   Eyes: Negative.   Respiratory: Negative.   Cardiovascular: Negative.   Gastrointestinal: Negative.   Genitourinary: Negative.   Musculoskeletal: Negative.   Skin: Negative.   Neurological: Negative.   Psychiatric/Behavioral: Negative.      Physical Exam Vitals: BP 123/77   Ht 5' 5.5" (1.664 m)   Wt 168 lb (76.2 kg)   BMI 27.53 kg/m   Physical Exam Constitutional:      General: She is not in acute distress.    Appearance: Normal appearance. She is well-developed.   Genitourinary:     Vulva and bladder normal.     Right Labia: No rash, tenderness, lesions, skin changes or Bartholin's cyst.    Left Labia: No tenderness, lesions, skin changes, Bartholin's cyst or rash.    No inguinal adenopathy present in the right or left side.    Pelvic Tanner Score: 5/5.    Vaginal cuff intact.    No vaginal discharge, erythema, tenderness or bleeding.     No vaginal prolapse present.    Moderate vaginal atrophy present.     Right Adnexa: not tender, not full and no mass present.    Left Adnexa: not tender, not full and no mass present.    Cervix is absent.     Uterus is absent.     Pelvic exam was performed with patient in the lithotomy position.  Breasts:     Right: No inverted nipple, mass, nipple discharge, skin change or tenderness.     Left: No inverted nipple, mass, nipple discharge, skin change or tenderness.    HENT:     Head: Normocephalic and atraumatic.  Eyes:     General: No scleral icterus.    Conjunctiva/sclera: Conjunctivae normal.  Neck:     Thyroid: No thyromegaly.  Cardiovascular:     Rate and Rhythm: Normal rate and regular rhythm.     Heart sounds: No murmur heard. No friction rub. No gallop.   Pulmonary:     Effort: Pulmonary effort is normal. No respiratory distress.     Breath sounds: Normal breath sounds. No wheezing or rales.  Abdominal:     General: Bowel sounds are normal. There is no distension.     Palpations: Abdomen is soft. There is no mass.     Tenderness: There is no abdominal tenderness. There is no guarding or rebound.     Hernia: There is no hernia in the left inguinal area or right inguinal area.  Musculoskeletal:        General: No swelling or tenderness. Normal range of motion.     Cervical back: Normal range of motion and neck supple.  Lymphadenopathy:     Cervical: No cervical adenopathy.     Lower Body: No right inguinal adenopathy. No left inguinal adenopathy.  Neurological:     General: No focal  deficit present.     Mental Status: She is alert and oriented to person, place, and time.     Cranial Nerves: No cranial nerve deficit.  Skin:    General: Skin is warm and dry.     Findings: No erythema or rash.  Psychiatric:        Mood and Affect: Mood normal.        Behavior: Behavior normal.  Judgment: Judgment normal.      Female chaperone present for pelvic and breast  portions of the physical exam  Results: AUDIT Questionnaire (screen for alcoholism): 0 PHQ-9: 5   Assessment and Plan:  60 y.o. G0P0000 female here for routine annual gynecologic examination  Plan: Problem List Items Addressed This Visit      Genitourinary   Atrophic vaginitis   Relevant Medications   estradiol (ESTRACE VAGINAL) 0.1 MG/GM vaginal cream (Start on 12/21/2020)    Other Visit Diagnoses    Women's annual routine gynecological examination    -  Primary   Screening for depression       Screening for alcoholism          Screening: -- Blood pressure screen managed by PCP -- Colonoscopy - not due -- Mammogram - not due -- Weight screening: normal -- Depression screening negative (PHQ-9) -- Nutrition: normal -- cholesterol screening: per PCP -- osteoporosis screening: not due -- tobacco screening: not using -- alcohol screening: AUDIT questionnaire indicates low-risk usage. -- family history of breast cancer screening: done. not at high risk. -- no evidence of domestic violence or intimate partner violence. -- STD screening: gonorrhea/chlamydia NAAT not collected per patient request. -- pap smear not collected per ASCCP guidelines    Thomasene Mohair, MD 12/20/2020 11:40 AM

## 2021-06-18 IMAGING — CR LEFT GREAT TOE
3 series · 3 of 3 positions shown · non-contrast
Comparison: None.

CLINICAL DATA: Crush injury left great toe today. Initial
encounter.

EXAM:
LEFT GREAT TOE

[toe ap]
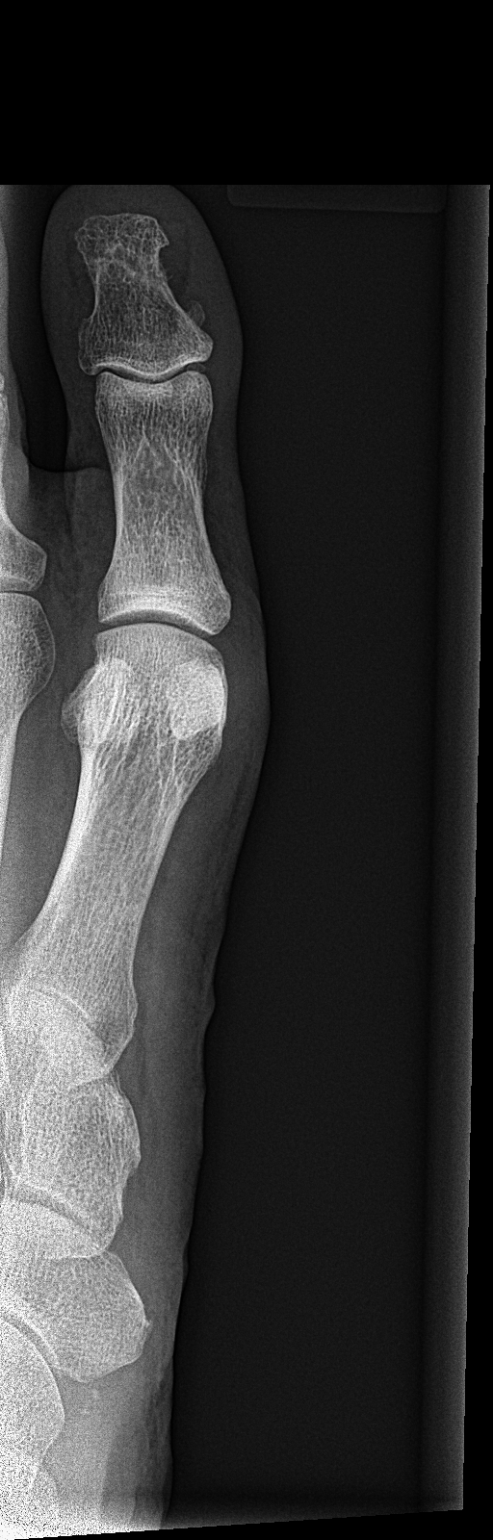

[toe obl]
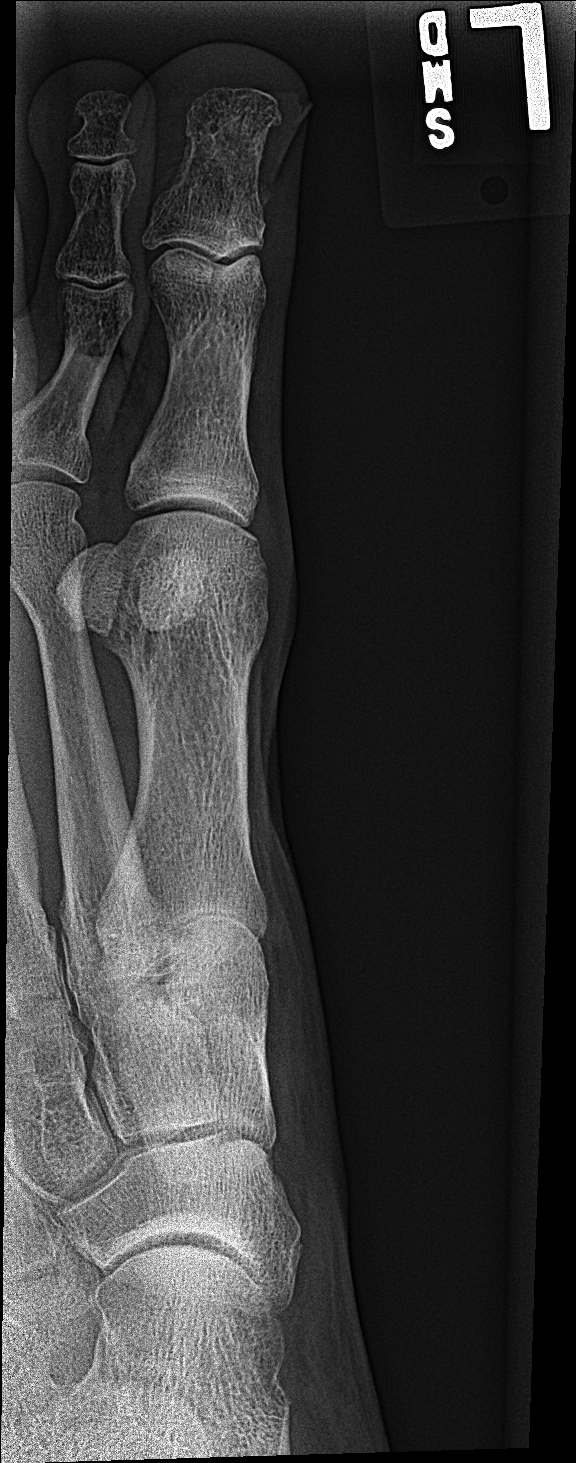

[toe lat]
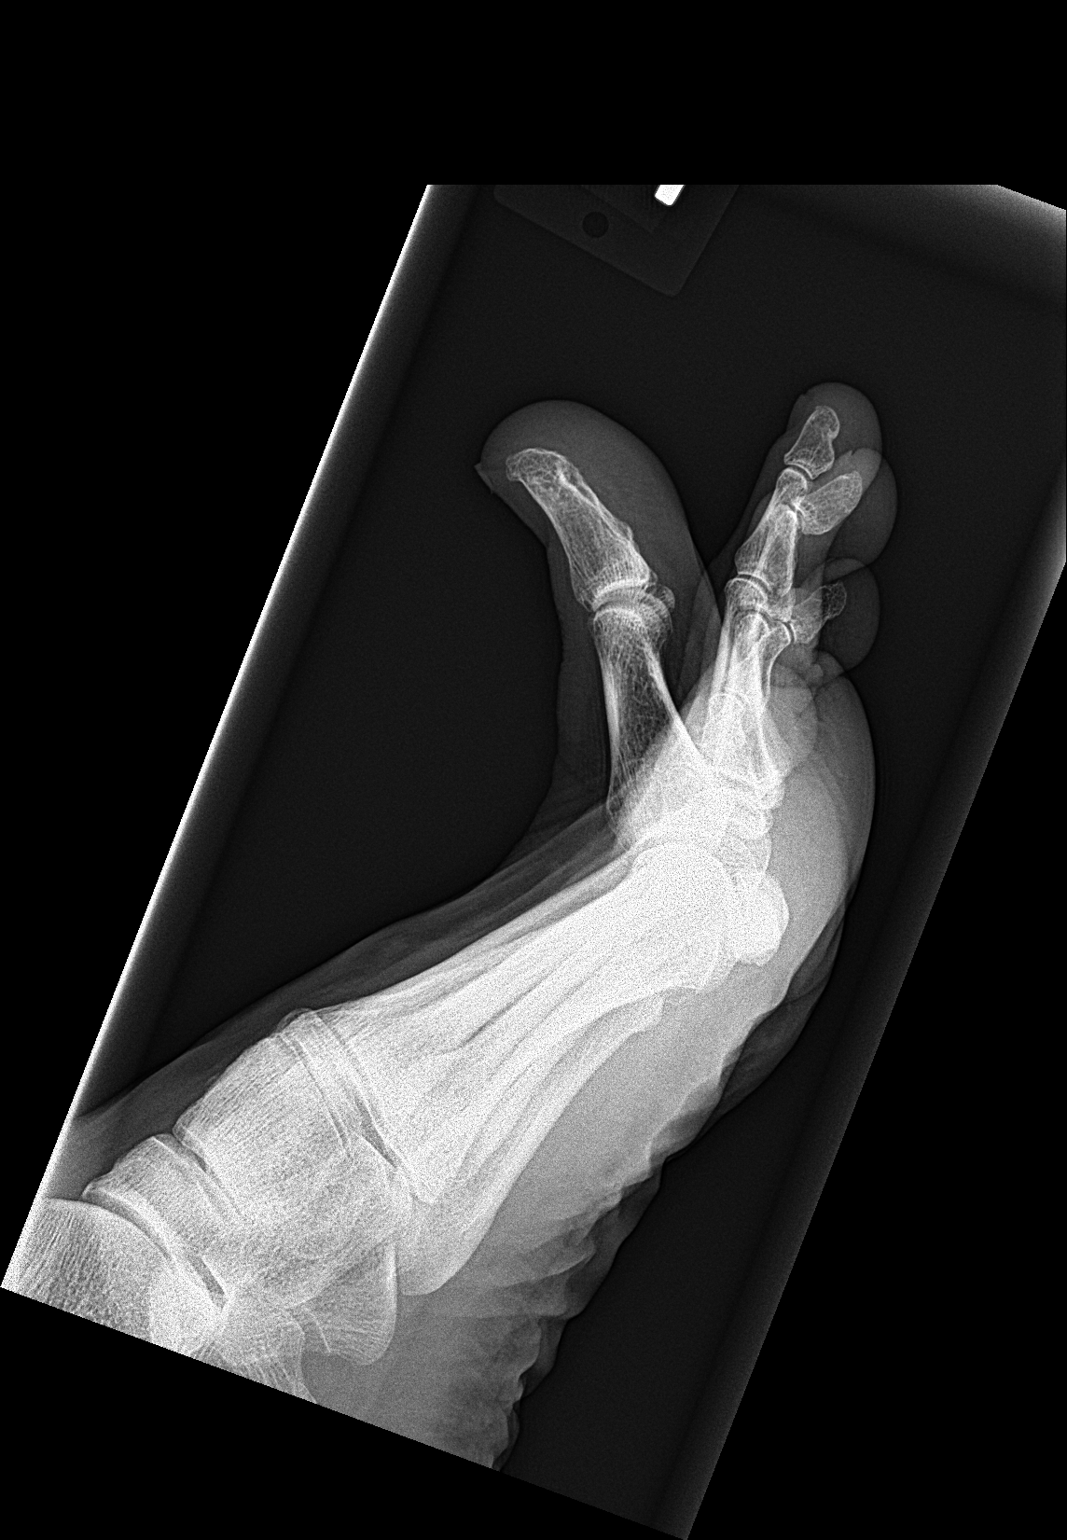

[3 of 3 positions shown; findings below may reference images not displayed]

FINDINGS: There is no evidence of fracture or dislocation. There is no
evidence of arthropathy or other focal bone abnormality. Soft
tissues are unremarkable.
IMPRESSION: Negative exam.

## 2022-03-11 ENCOUNTER — Ambulatory Visit
Admission: EM | Admit: 2022-03-11 | Discharge: 2022-03-11 | Disposition: A | Discharge status: Home / Self Care | Payer: BC Managed Care – PPO | Attending: Emergency Medicine | Admitting: Emergency Medicine

## 2022-03-11 DIAGNOSIS — R102 Pelvic and perineal pain: Secondary | ICD-10-CM | POA: Insufficient documentation

## 2022-03-11 DIAGNOSIS — N3289 Other specified disorders of bladder: Secondary | ICD-10-CM | POA: Insufficient documentation

## 2022-03-11 LAB — URINALYSIS, MICROSCOPIC (REFLEX)

## 2022-03-11 LAB — URINALYSIS, ROUTINE W REFLEX MICROSCOPIC
Bilirubin Urine: NEGATIVE
Glucose, UA: NEGATIVE mg/dL
Hgb urine dipstick: NEGATIVE
Ketones, ur: NEGATIVE mg/dL
Nitrite: NEGATIVE
Protein, ur: NEGATIVE mg/dL
Specific Gravity, Urine: 1.01 (ref 1.005–1.030)
pH: 7 (ref 5.0–8.0)

## 2022-03-11 MED ORDER — NITROFURANTOIN MONOHYD MACRO 100 MG PO CAPS: 100.0000 mg | ORAL_CAPSULE | Freq: Two times a day (BID) | ORAL | 0 refills | Status: DC

## 2022-03-11 NOTE — ED Triage Notes (Signed)
Patient to Urgent Care with complaints of suprapubic pain that started this morning. Also reports urinary frequency and urgency. Denies dysuria/ fevers.   Reports hx of the same w/ negative work up.

## 2022-03-11 NOTE — ED Provider Notes (Signed)
[facility]    CSN: [id number] Arrival date & time: 03/11/22  1852      History   Chief Complaint Chief Complaint  Patient presents with   Urinary Frequency    HPI Deborah Vargas is a 61 y.o. female.   HPI  She is in today complaining of pelvic pain that started this morning.  The pain progressed until after work.  At that time she decided she needs to be evaluated.  She reports having a pressure with urination with a possible spasm.  She describes it as a pulling.  She does admit that the pain has not resolved at this time.  She denies recurrent history of UTI.  Blood felt like it was important to have her urine evaluated today.  She denies any fever, chills, nausea, vomiting, hematuria or any recent trauma.  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   Hay fever 03/14/2015   Essential (primary) hypertension 03/14/2015   Personal history of infectious and parasitic disease 03/14/2015   H/O nutritional disorder 03/14/2015   Thyroid disease 03/14/2015   Osteopenia of the elderly 03/14/2015    Past Surgical History:  Procedure Laterality Date   BLEPHAROPLASTY  01/23/2017   upper bilateral eye lids   CHOLECYSTECTOMY     COLONOSCOPY  2016   Dr Luan Pulling @ [facility]- removed polyp   VAGINAL HYSTERECTOMY      OB History     Gravida  0   Para  0   Term  0   Preterm  0   AB  0   Living  0      SAB  0   IAB  0   Ectopic  0   Multiple  0   Live Births  0            Home Medications    Prior to Admission medications   Medication Sig Start Date End Date  Taking? Authorizing Provider  nitrofurantoin, macrocrystal-monohydrate, (MACROBID) 100 MG capsule Take 1 capsule (100 mg total) by mouth 2 (two) times daily. 03/11/22  Yes Rital Cavey, Shana Chute, NP  amLODipine-benazepril (LOTREL) 5-10 MG capsule Take 1 capsule by mouth daily. 09/10/18   Duanne Limerick, MD  Calcium Carbonate-Vitamin D (CALTRATE 600+D PO) Take 1 tablet by mouth daily at 6 (six) AM.    [provider]  doxycycline (VIBRAMYCIN) 100 MG capsule Take 1 capsule (100 mg total) by mouth 2 (two) times daily. 11/23/19   Tommie Sams, DO  ergocalciferol (VITAMIN D2) 50000 UNITS capsule Take 1 capsule by mouth every 14 (fourteen) days. Dr Sherryl Manges    [provider]  estradiol (ESTRACE VAGINAL) 0.1 MG/GM vaginal cream Place 1 Applicatorful vaginally 2 (two) times a week. 12/21/20   Conard Novak, MD  fexofenadine (ALLEGRA) 60 MG tablet Take 1 tablet by mouth daily at 6 (six) AM.    [provider]  fluticasone (FLONASE) 50 MCG/ACT nasal spray USE 1 SPRAY IN EACH NOSTRIL AS NEEDED 09/10/18   Duanne Limerick, MD  hydrocortisone (PROCTOSOL HC) 2.5 %  rectal cream Place 1 application rectally 2 (two) times daily. 12/14/20   Copland, Ilona Sorrel, PA-C  levothyroxine (SYNTHROID) 125 MCG tablet Dr Rosalita Chessman 10/07/16   [provider]    Family History Family History  Problem Relation Age of Onset   Diabetes Father    Heart disease Father    Alzheimer's disease Father    Graves' disease Mother    Thyroid disease Mother    ALS Mother    Hypertension Mother    Thyroid disease Brother    Thyroid disease Maternal Grandmother     Social History Social History   Tobacco Use   Smoking status: Never   Smokeless tobacco: Never  Vaping Use   Vaping Use: Never used  Substance Use Topics   Alcohol use: No    Alcohol/week: 0.0 standard drinks of alcohol   Drug use: No     Allergies   Diclofenac   Review of Systems Review of Systems   Physical Exam Triage Vital  Signs ED Triage Vitals  Enc Vitals Group     BP 03/11/22 1948 115/65     Pulse Rate 03/11/22 1948 67     Resp 03/11/22 1948 18     Temp 03/11/22 1948 98.1 F (36.7 C)     Temp Source 03/11/22 1948 Oral     SpO2 03/11/22 1948 99 %     Weight --      Height 03/11/22 1949 5' 5.5" (1.664 m)     Head Circumference --      Peak Flow --      Pain Score 03/11/22 1949 0     Pain Loc --      Pain Edu? --      Excl. in [place]? --    No data found.  Updated Vital Signs BP 115/65   Pulse 67   Temp 98.1 F (36.7 C) (Oral)   Resp 18   Ht 5' 5.5" (1.664 m)   SpO2 99%   BMI 27.53 kg/m   Visual Acuity Right Eye Distance:   Left Eye Distance:   Bilateral Distance:    Right Eye Near:   Left Eye Near:    Bilateral Near:     Physical Exam   UC Treatments / Results  Labs (all labs ordered are listed, but only abnormal results are displayed) Labs Reviewed  URINALYSIS, ROUTINE W REFLEX MICROSCOPIC - Abnormal; Notable for the following components:      Result Value   Leukocytes,Ua TRACE (*)    All other components within normal limits  URINALYSIS, MICROSCOPIC (REFLEX) - Abnormal; Notable for the following components:   Bacteria, UA FEW (*)    All other components within normal limits  URINE CULTURE    EKG   Radiology No results found.  Procedures Procedures (including critical care time)  Medications Ordered in UC Medications - No data to display  Initial Impression / Assessment and Plan / UC Course  I have reviewed the triage vital signs and the nursing notes.  Pertinent labs & imaging results that were available during my care of the patient were reviewed by me and considered in my medical decision making (see chart for details).     Suprapubic pain Final Clinical Impressions(s) / UC Diagnoses   Final diagnoses:  Pelvic pain  Bladder spasm     Discharge Instructions      Your urinalysis is positive for bacteria. Urine culture pending this confirms the type  of bacteria and if the treatment  is effective. Due to your symptoms and history you have been started on a treatment of Macrobid  twice a day for 5 days.  Encourage completion of your treatment even when symptoms improve.  Discussed resistance with antibiotic if over used.   Discussed allergic reactions with antibiotics Encourage increasing hydration with water and how to tell when this is achieved Add cranberry juice 100% 8 -16 ozs daily until symptoms improve Discussed hygiene and voiding when the urge presents  Patient was provided with instructions.  She verbalized understanding and agreement with treatment plan       ED Prescriptions     Medication Sig Dispense Auth. Provider   nitrofurantoin, macrocrystal-monohydrate, (MACROBID) 100 MG capsule Take 1 capsule (100 mg total) by mouth 2 (two) times daily. 10 capsule Barbette Merino, NP      PDMP not reviewed this encounter.   Thad Ranger Albers, [place] 03/11/22 [phone]

## 2022-03-11 NOTE — Discharge Instructions (Addendum)
Your urinalysis is positive for bacteria. Urine culture pending this confirms the type of bacteria and if the treatment is effective. Due to your symptoms and history you have been started on a treatment of Macrobid  twice a day for 5 days.  Encourage completion of your treatment even when symptoms improve.  Discussed resistance with antibiotic if over used.   Discussed allergic reactions with antibiotics Encourage increasing hydration with water and how to tell when this is achieved Add cranberry juice 100% 8 -16 ozs daily until symptoms improve Discussed hygiene and voiding when the urge presents  Patient was provided with instructions.  She verbalized understanding and agreement with treatment plan

## 2022-03-13 LAB — URINE CULTURE

## 2022-05-14 ENCOUNTER — Ambulatory Visit
Admission: EM | Admit: 2022-05-14 | Discharge: 2022-05-14 | Disposition: A | Discharge status: Home / Self Care | Payer: BC Managed Care – PPO | Attending: Family Medicine | Admitting: Family Medicine

## 2022-05-14 DIAGNOSIS — J0100 Acute maxillary sinusitis, unspecified: Secondary | ICD-10-CM

## 2022-05-14 MED ORDER — PREDNISONE 10 MG PO TABS: 20.0000 mg | ORAL_TABLET | Freq: Every day | ORAL | 0 refills | Status: AC

## 2022-05-14 MED ORDER — AZITHROMYCIN 250 MG PO TABS: ORAL_TABLET | ORAL | 0 refills | Status: AC

## 2022-05-14 NOTE — ED Triage Notes (Signed)
Pt c/o nasal congestion, and facial pressure x1week  Pt was doing yard work last week and inhaled pine and leaf dust.  Pt states that she is having sinus congestion, "puffiness" along the neck and ears.  Pt took Allegra D and sudafed and it did not help.   Pt has been taking flonase and Afrin nose spray for 5+ days

## 2022-05-14 NOTE — ED Provider Notes (Signed)
[facility]    CSN: [id number] Arrival date & time: 05/14/22  1826      History   Chief Complaint Chief Complaint  Patient presents with   Nasal Congestion    HPI Deborah Vargas is a 61 y.o. female.   HPI   Deborah Vargas presents for ongoing nasal congestion and headache for the past week.  Patient states she was doing some yard work without a mask on and inhaled some tiny leaf dust.  She has been using Sudafed, Allegra-D without relief.  She has been using Flonase daily but that does not help as well.  Of note, she has been using Afrin for the 5+ days.  She feels like she may have had a fever about a week ago.  Says her husband also was sick at some time.  She does not have a fever recently.  She has been trying over-the-counter medications and none of them are helping.  She wanted to get her flu COVID vaccination today but was told that she cannot get it until she is evaluated for her nasal congestion.  Reports she typically gets her pharmaceutical advice from her father who is a Software engineer but her father unfortunately has passed away.   Endorses some nausea but no vomiting.  Feels like her throat is somewhat puffy.  Has bilateral ear discomfort.  No diarrhea, chest tightness or shortness of breath.  He works in a call center.         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   Hay fever 03/14/2015   Essential (primary) hypertension 03/14/2015   Personal history of infectious and parasitic disease 03/14/2015   H/O nutritional disorder 03/14/2015   Thyroid disease  03/14/2015   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 Authorizing Provider  amLODipine-benazepril (LOTREL) 5-10 MG capsule Take 1 capsule by mouth daily. 09/10/18  Yes Juline Patch, MD  azithromycin (ZITHROMAX Z-PAK) 250 MG tablet Take 2 tablets (500 mg total) by mouth daily for 1 day, THEN 1 tablet (250 mg total) daily for 4 days. 05/14/22 05/19/22 Yes Ahren Pettinger, DO  Calcium Carbonate-Vitamin D (CALTRATE 600+D PO) Take 1 tablet by mouth daily at 6 (six) AM.   Yes [provider]  ergocalciferol (VITAMIN D2) 50000  UNITS capsule Take 1 capsule by mouth every 14 (fourteen) days. Dr Michiel Sites   Yes [provider]  estradiol (ESTRACE VAGINAL) 0.1 MG/GM vaginal cream Place 1 Applicatorful vaginally 2 (two) times a week. 12/21/20  Yes Will Bonnet, MD  fexofenadine (ALLEGRA) 60 MG tablet Take 1 tablet by mouth daily at 6 (six) AM.   Yes [provider]  fluticasone (FLONASE) 50 MCG/ACT nasal spray USE 1 SPRAY IN EACH NOSTRIL AS NEEDED 09/10/18  Yes Juline Patch, MD  hydrocortisone (PROCTOSOL HC) 2.5 % rectal cream Place 1 application rectally 2 (two) times daily. 9/56/21  Yes Copland, Deirdre Evener, PA-C  levothyroxine (SYNTHROID) 125 MCG tablet Dr Frederik Pear 10/07/16  Yes [provider]  predniSONE (DELTASONE) 10 MG tablet Take 2 tablets (20 mg total) by mouth daily for 5 days. 05/14/22 05/19/22 Yes Greig Altergott, Ronnette Juniper, DO    Family History Family History  Problem Relation Age of  Onset   Diabetes Father    Heart disease Father    Alzheimer's disease Father    Graves' disease Mother    Thyroid disease Mother    ALS Mother    Hypertension Mother    Thyroid disease Brother    Thyroid disease Maternal Grandmother     Social History Social History   Tobacco Use   Smoking status: Never   Smokeless tobacco: Never  Vaping Use   Vaping Use: Never used  Substance Use Topics   Alcohol use: No    Alcohol/week: 0.0 standard drinks of alcohol   Drug use: No     Allergies   Diclofenac   Review of Systems Review of Systems: negative unless otherwise stated in HPI.      Physical Exam Triage Vital Signs ED Triage Vitals  Enc Vitals Group     BP 05/14/22 1847 117/60     Pulse Rate 05/14/22 1847 69     Resp 05/14/22 1847 18     Temp 05/14/22 1847 97.8 F (36.6 C)     Temp Source 05/14/22 1847 Oral     SpO2 05/14/22 1847 99 %     Weight 05/14/22 1845 145 lb (65.8 kg)     Height 05/14/22 1845 5\' 5"  (1.651 m)     Head Circumference --      Peak Flow --      Pain Score 05/14/22 1845 0     Pain Loc --      Pain Edu? --      Excl. in [place]? --    No data found.  Updated Vital Signs BP 117/60 (BP Location: Left Arm)   Pulse 69   Temp 97.8 F (36.6 C) (Oral)   Resp 18   Ht 5\' 5"  (1.651 m)   Wt 65.8 kg   SpO2 99%   BMI 24.13 kg/m   Visual Acuity Right Eye Distance:   Left Eye Distance:   Bilateral Distance:    Right Eye Near:   Left Eye Near:    Bilateral Near:     Physical Exam GEN:     alert, anxious, non-ill  appearing female  HENT:  mucus membranes moist, oropharyngeal  without lesions or  exudate, no  tonsillar hypertrophy,   mild oropharyngeal erythema, moderate erythematous edematous turbinates, no  nasal discharge,  bilateral TM effusion, no erythema, bilateral maxillary sinus tenderness   EYES:   pupils equal and reactive, EOMi ,  no scleral injection NECK:  normal ROM, anterior cervical lymphadenopathy bilaterally RESP:  no  increased work of breathing,  clear to auscultation bilaterally CVS:   regular rate  and rhythm Skin:   warm and dry, no rash on visible skin , normal  skin turgor    UC Treatments / Results  Labs (all labs ordered are listed, but only abnormal results are displayed) Labs Reviewed - No data to display  EKG   Radiology No results found.  Procedures Procedures (including critical care time)  Medications Ordered in UC Medications - No data to display  Initial Impression / Assessment and Plan / UC Course  I have reviewed the triage vital signs and the nursing notes.  Pertinent labs & imaging results that were available during my care of the patient were reviewed by me and considered in my medical decision making (see chart for details).       Pt is a 61 y.o. female who presents for ongoing nasal congestion for the past week. Sarahjean is  afebrile here without recent antipyretics. Satting well on room air. Overall pt is  well appearing, well hydrated, without respiratory distress. Pulmonary exam  is unremarkable. Home COVID testing was negative. Discussed symptomatic treatment.  Treat with amoxicillin for presumed bacterial sinusitis. Rx sent to pharmacy. Discussed typical duration of symptoms. OTC symptom care as needed. Ensure adequate fluid intake and rest.   Reviewed expectations re: course of current medical issues. Questions answered. Return and ED precautions given.  Patient verbalized understanding. After Visit Summary given.  Discussed MDM, treatment plan and plan for follow-up with patient who agrees with plan.     Final Clinical Impressions(s) / UC Diagnoses   Final diagnoses:  Acute non-recurrent maxillary sinusitis     Discharge Instructions      Stop by the pharmacy to pick up your prescriptions. Take the steroids with breakfast in the morning.  Stop using Afrin and Sudafed. You can take Flonase and Allegra.   Follow up with your primary care provider, as  needed.      ED Prescriptions     Medication Sig Dispense Auth. Provider   azithromycin (ZITHROMAX Z-PAK) 250 MG tablet Take 2 tablets (500 mg total) by mouth daily for 1 day, THEN 1 tablet (250 mg total) daily for 4 days. 6 tablet Jaylani Mcguinn, DO   predniSONE (DELTASONE) 10 MG tablet Take 2 tablets (20 mg total) by mouth daily for 5 days. 10 tablet Lyndee Hensen, DO      PDMP not reviewed this encounter.   Lyndee Hensen, DO 05/14/22 2143

## 2022-05-14 NOTE — Discharge Instructions (Addendum)
Stop by the pharmacy to pick up your prescriptions. Take the steroids with breakfast in the morning.  Stop using Afrin and Sudafed. You can take Flonase and Allegra.   Follow up with your primary care provider, as needed.
